# Patient Record
Sex: Male | Born: 1935 | Race: White | Hispanic: No | Smoking: Former smoker
Health system: Southern US, Community
[De-identification: ages and names within clinical notes are randomized; demographics above are authoritative.]

## PROBLEM LIST (undated history)

## (undated) DIAGNOSIS — I10 Essential (primary) hypertension: Secondary | ICD-10-CM

## (undated) DIAGNOSIS — I482 Chronic atrial fibrillation, unspecified: Secondary | ICD-10-CM

## (undated) DIAGNOSIS — E119 Type 2 diabetes mellitus without complications: Secondary | ICD-10-CM

## (undated) DIAGNOSIS — I639 Cerebral infarction, unspecified: Secondary | ICD-10-CM

## (undated) HISTORY — PX: PROSTATE SURGERY: SHX751

## (undated) HISTORY — PX: HERNIA REPAIR: SHX51

---

## 2014-09-10 DIAGNOSIS — R21 Rash and other nonspecific skin eruption: Secondary | ICD-10-CM | POA: Diagnosis not present

## 2014-09-10 DIAGNOSIS — Z6824 Body mass index (BMI) 24.0-24.9, adult: Secondary | ICD-10-CM | POA: Diagnosis not present

## 2014-09-11 DIAGNOSIS — R972 Elevated prostate specific antigen [PSA]: Secondary | ICD-10-CM | POA: Diagnosis not present

## 2014-09-11 DIAGNOSIS — R351 Nocturia: Secondary | ICD-10-CM | POA: Diagnosis not present

## 2014-09-11 DIAGNOSIS — N3090 Cystitis, unspecified without hematuria: Secondary | ICD-10-CM | POA: Diagnosis not present

## 2014-09-11 DIAGNOSIS — R3912 Poor urinary stream: Secondary | ICD-10-CM | POA: Diagnosis not present

## 2014-09-11 DIAGNOSIS — N401 Enlarged prostate with lower urinary tract symptoms: Secondary | ICD-10-CM | POA: Diagnosis not present

## 2014-09-20 DIAGNOSIS — R0982 Postnasal drip: Secondary | ICD-10-CM | POA: Diagnosis not present

## 2014-09-20 DIAGNOSIS — R21 Rash and other nonspecific skin eruption: Secondary | ICD-10-CM | POA: Diagnosis not present

## 2014-09-26 DIAGNOSIS — I10 Essential (primary) hypertension: Secondary | ICD-10-CM | POA: Diagnosis not present

## 2014-09-26 DIAGNOSIS — E860 Dehydration: Secondary | ICD-10-CM | POA: Diagnosis not present

## 2014-09-26 DIAGNOSIS — Z7982 Long term (current) use of aspirin: Secondary | ICD-10-CM | POA: Diagnosis not present

## 2014-09-26 DIAGNOSIS — E119 Type 2 diabetes mellitus without complications: Secondary | ICD-10-CM | POA: Diagnosis not present

## 2014-09-26 DIAGNOSIS — R079 Chest pain, unspecified: Secondary | ICD-10-CM | POA: Diagnosis not present

## 2014-09-26 DIAGNOSIS — J9811 Atelectasis: Secondary | ICD-10-CM | POA: Diagnosis not present

## 2014-09-26 DIAGNOSIS — Z87891 Personal history of nicotine dependence: Secondary | ICD-10-CM | POA: Diagnosis not present

## 2014-09-26 DIAGNOSIS — R531 Weakness: Secondary | ICD-10-CM | POA: Diagnosis not present

## 2014-09-26 DIAGNOSIS — R42 Dizziness and giddiness: Secondary | ICD-10-CM | POA: Diagnosis not present

## 2014-09-26 DIAGNOSIS — J984 Other disorders of lung: Secondary | ICD-10-CM | POA: Diagnosis not present

## 2014-10-10 DIAGNOSIS — Z6825 Body mass index (BMI) 25.0-25.9, adult: Secondary | ICD-10-CM | POA: Diagnosis not present

## 2014-10-10 DIAGNOSIS — I471 Supraventricular tachycardia: Secondary | ICD-10-CM | POA: Diagnosis not present

## 2014-10-10 DIAGNOSIS — I10 Essential (primary) hypertension: Secondary | ICD-10-CM | POA: Diagnosis not present

## 2014-10-10 DIAGNOSIS — E860 Dehydration: Secondary | ICD-10-CM | POA: Diagnosis not present

## 2014-10-16 DIAGNOSIS — I471 Supraventricular tachycardia: Secondary | ICD-10-CM | POA: Diagnosis not present

## 2014-10-16 DIAGNOSIS — I10 Essential (primary) hypertension: Secondary | ICD-10-CM | POA: Diagnosis not present

## 2014-10-16 DIAGNOSIS — R609 Edema, unspecified: Secondary | ICD-10-CM | POA: Diagnosis not present

## 2014-11-13 DIAGNOSIS — Z6825 Body mass index (BMI) 25.0-25.9, adult: Secondary | ICD-10-CM | POA: Diagnosis not present

## 2014-11-13 DIAGNOSIS — L282 Other prurigo: Secondary | ICD-10-CM | POA: Diagnosis not present

## 2014-12-18 DIAGNOSIS — E538 Deficiency of other specified B group vitamins: Secondary | ICD-10-CM | POA: Diagnosis not present

## 2014-12-18 DIAGNOSIS — I10 Essential (primary) hypertension: Secondary | ICD-10-CM | POA: Diagnosis not present

## 2014-12-18 DIAGNOSIS — E119 Type 2 diabetes mellitus without complications: Secondary | ICD-10-CM | POA: Diagnosis not present

## 2014-12-18 DIAGNOSIS — E785 Hyperlipidemia, unspecified: Secondary | ICD-10-CM | POA: Diagnosis not present

## 2014-12-18 DIAGNOSIS — G309 Alzheimer's disease, unspecified: Secondary | ICD-10-CM | POA: Diagnosis not present

## 2014-12-20 DIAGNOSIS — R269 Unspecified abnormalities of gait and mobility: Secondary | ICD-10-CM | POA: Diagnosis not present

## 2014-12-20 DIAGNOSIS — R2681 Unsteadiness on feet: Secondary | ICD-10-CM | POA: Diagnosis not present

## 2014-12-20 DIAGNOSIS — G309 Alzheimer's disease, unspecified: Secondary | ICD-10-CM | POA: Diagnosis not present

## 2014-12-22 DIAGNOSIS — S91331A Puncture wound without foreign body, right foot, initial encounter: Secondary | ICD-10-CM | POA: Diagnosis not present

## 2015-01-01 DIAGNOSIS — E119 Type 2 diabetes mellitus without complications: Secondary | ICD-10-CM | POA: Diagnosis not present

## 2015-01-26 DIAGNOSIS — I872 Venous insufficiency (chronic) (peripheral): Secondary | ICD-10-CM | POA: Diagnosis not present

## 2015-01-26 DIAGNOSIS — Z6826 Body mass index (BMI) 26.0-26.9, adult: Secondary | ICD-10-CM | POA: Diagnosis not present

## 2015-01-26 DIAGNOSIS — R600 Localized edema: Secondary | ICD-10-CM | POA: Diagnosis not present

## 2015-02-19 DIAGNOSIS — Z9181 History of falling: Secondary | ICD-10-CM | POA: Diagnosis not present

## 2015-02-19 DIAGNOSIS — M25531 Pain in right wrist: Secondary | ICD-10-CM | POA: Diagnosis not present

## 2015-02-19 DIAGNOSIS — Z6825 Body mass index (BMI) 25.0-25.9, adult: Secondary | ICD-10-CM | POA: Diagnosis not present

## 2015-02-19 DIAGNOSIS — Z139 Encounter for screening, unspecified: Secondary | ICD-10-CM | POA: Diagnosis not present

## 2015-02-19 DIAGNOSIS — S61509A Unspecified open wound of unspecified wrist, initial encounter: Secondary | ICD-10-CM | POA: Diagnosis not present

## 2015-03-12 DIAGNOSIS — N3090 Cystitis, unspecified without hematuria: Secondary | ICD-10-CM | POA: Diagnosis not present

## 2015-03-12 DIAGNOSIS — R3912 Poor urinary stream: Secondary | ICD-10-CM | POA: Diagnosis not present

## 2015-03-12 DIAGNOSIS — N401 Enlarged prostate with lower urinary tract symptoms: Secondary | ICD-10-CM | POA: Diagnosis not present

## 2015-03-12 DIAGNOSIS — R351 Nocturia: Secondary | ICD-10-CM | POA: Diagnosis not present

## 2015-03-12 DIAGNOSIS — N318 Other neuromuscular dysfunction of bladder: Secondary | ICD-10-CM | POA: Diagnosis not present

## 2015-03-12 DIAGNOSIS — R972 Elevated prostate specific antigen [PSA]: Secondary | ICD-10-CM | POA: Diagnosis not present

## 2015-05-20 DIAGNOSIS — N3090 Cystitis, unspecified without hematuria: Secondary | ICD-10-CM | POA: Diagnosis not present

## 2015-05-20 DIAGNOSIS — N3001 Acute cystitis with hematuria: Secondary | ICD-10-CM | POA: Diagnosis not present

## 2015-06-21 DIAGNOSIS — Z23 Encounter for immunization: Secondary | ICD-10-CM | POA: Diagnosis not present

## 2015-06-21 DIAGNOSIS — I10 Essential (primary) hypertension: Secondary | ICD-10-CM | POA: Diagnosis not present

## 2015-06-21 DIAGNOSIS — D519 Vitamin B12 deficiency anemia, unspecified: Secondary | ICD-10-CM | POA: Diagnosis not present

## 2015-06-21 DIAGNOSIS — E785 Hyperlipidemia, unspecified: Secondary | ICD-10-CM | POA: Diagnosis not present

## 2015-06-21 DIAGNOSIS — E119 Type 2 diabetes mellitus without complications: Secondary | ICD-10-CM | POA: Diagnosis not present

## 2015-06-27 DIAGNOSIS — N3001 Acute cystitis with hematuria: Secondary | ICD-10-CM | POA: Diagnosis not present

## 2015-06-27 DIAGNOSIS — R300 Dysuria: Secondary | ICD-10-CM | POA: Diagnosis not present

## 2015-07-22 DIAGNOSIS — N3090 Cystitis, unspecified without hematuria: Secondary | ICD-10-CM | POA: Diagnosis not present

## 2015-07-22 DIAGNOSIS — N3091 Cystitis, unspecified with hematuria: Secondary | ICD-10-CM | POA: Diagnosis not present

## 2015-08-06 DIAGNOSIS — N401 Enlarged prostate with lower urinary tract symptoms: Secondary | ICD-10-CM | POA: Diagnosis not present

## 2015-08-06 DIAGNOSIS — N3020 Other chronic cystitis without hematuria: Secondary | ICD-10-CM | POA: Diagnosis not present

## 2015-08-06 DIAGNOSIS — R351 Nocturia: Secondary | ICD-10-CM | POA: Diagnosis not present

## 2015-08-06 DIAGNOSIS — N318 Other neuromuscular dysfunction of bladder: Secondary | ICD-10-CM | POA: Diagnosis not present

## 2015-08-07 DIAGNOSIS — J069 Acute upper respiratory infection, unspecified: Secondary | ICD-10-CM | POA: Diagnosis not present

## 2015-08-07 DIAGNOSIS — Z6826 Body mass index (BMI) 26.0-26.9, adult: Secondary | ICD-10-CM | POA: Diagnosis not present

## 2015-08-07 DIAGNOSIS — J029 Acute pharyngitis, unspecified: Secondary | ICD-10-CM | POA: Diagnosis not present

## 2015-08-13 DIAGNOSIS — Z6825 Body mass index (BMI) 25.0-25.9, adult: Secondary | ICD-10-CM | POA: Diagnosis not present

## 2015-08-13 DIAGNOSIS — G309 Alzheimer's disease, unspecified: Secondary | ICD-10-CM | POA: Diagnosis not present

## 2015-08-13 DIAGNOSIS — R4182 Altered mental status, unspecified: Secondary | ICD-10-CM | POA: Diagnosis not present

## 2015-08-14 DIAGNOSIS — Z79899 Other long term (current) drug therapy: Secondary | ICD-10-CM | POA: Diagnosis not present

## 2015-08-14 DIAGNOSIS — I482 Chronic atrial fibrillation: Secondary | ICD-10-CM | POA: Diagnosis not present

## 2015-08-14 DIAGNOSIS — R531 Weakness: Secondary | ICD-10-CM | POA: Diagnosis not present

## 2015-08-14 DIAGNOSIS — R410 Disorientation, unspecified: Secondary | ICD-10-CM | POA: Diagnosis not present

## 2015-08-14 DIAGNOSIS — Z7982 Long term (current) use of aspirin: Secondary | ICD-10-CM | POA: Diagnosis not present

## 2015-08-14 DIAGNOSIS — F0390 Unspecified dementia without behavioral disturbance: Secondary | ICD-10-CM | POA: Diagnosis not present

## 2015-08-14 DIAGNOSIS — E7800 Pure hypercholesterolemia, unspecified: Secondary | ICD-10-CM | POA: Diagnosis not present

## 2015-08-14 DIAGNOSIS — N390 Urinary tract infection, site not specified: Secondary | ICD-10-CM | POA: Diagnosis not present

## 2015-08-14 DIAGNOSIS — Z7984 Long term (current) use of oral hypoglycemic drugs: Secondary | ICD-10-CM | POA: Diagnosis not present

## 2015-08-14 DIAGNOSIS — Z87440 Personal history of urinary (tract) infections: Secondary | ICD-10-CM | POA: Diagnosis not present

## 2015-08-14 DIAGNOSIS — E119 Type 2 diabetes mellitus without complications: Secondary | ICD-10-CM | POA: Diagnosis not present

## 2015-08-14 DIAGNOSIS — R4182 Altered mental status, unspecified: Secondary | ICD-10-CM | POA: Diagnosis not present

## 2015-08-14 DIAGNOSIS — E871 Hypo-osmolality and hyponatremia: Secondary | ICD-10-CM | POA: Diagnosis not present

## 2015-08-14 DIAGNOSIS — I10 Essential (primary) hypertension: Secondary | ICD-10-CM | POA: Diagnosis not present

## 2015-08-15 DIAGNOSIS — E871 Hypo-osmolality and hyponatremia: Secondary | ICD-10-CM | POA: Diagnosis not present

## 2015-08-15 DIAGNOSIS — R410 Disorientation, unspecified: Secondary | ICD-10-CM | POA: Diagnosis not present

## 2015-08-15 DIAGNOSIS — I482 Chronic atrial fibrillation: Secondary | ICD-10-CM | POA: Diagnosis not present

## 2015-08-15 DIAGNOSIS — E119 Type 2 diabetes mellitus without complications: Secondary | ICD-10-CM | POA: Diagnosis not present

## 2015-08-27 DIAGNOSIS — E871 Hypo-osmolality and hyponatremia: Secondary | ICD-10-CM | POA: Diagnosis not present

## 2015-08-27 DIAGNOSIS — Z1389 Encounter for screening for other disorder: Secondary | ICD-10-CM | POA: Diagnosis not present

## 2015-08-27 DIAGNOSIS — Z6825 Body mass index (BMI) 25.0-25.9, adult: Secondary | ICD-10-CM | POA: Diagnosis not present

## 2015-08-27 DIAGNOSIS — R410 Disorientation, unspecified: Secondary | ICD-10-CM | POA: Diagnosis not present

## 2015-08-27 DIAGNOSIS — I10 Essential (primary) hypertension: Secondary | ICD-10-CM | POA: Diagnosis not present

## 2015-09-10 DIAGNOSIS — R600 Localized edema: Secondary | ICD-10-CM | POA: Insufficient documentation

## 2015-09-10 DIAGNOSIS — I471 Supraventricular tachycardia: Secondary | ICD-10-CM | POA: Diagnosis not present

## 2015-09-10 DIAGNOSIS — I10 Essential (primary) hypertension: Secondary | ICD-10-CM | POA: Insufficient documentation

## 2015-09-11 DIAGNOSIS — J069 Acute upper respiratory infection, unspecified: Secondary | ICD-10-CM | POA: Diagnosis not present

## 2015-10-02 ENCOUNTER — Encounter: Payer: Self-pay | Admitting: Sports Medicine

## 2015-10-02 ENCOUNTER — Ambulatory Visit (INDEPENDENT_AMBULATORY_CARE_PROVIDER_SITE_OTHER): Payer: Medicare Other | Admitting: Sports Medicine

## 2015-10-02 DIAGNOSIS — M79672 Pain in left foot: Secondary | ICD-10-CM | POA: Diagnosis not present

## 2015-10-02 DIAGNOSIS — M79671 Pain in right foot: Secondary | ICD-10-CM | POA: Diagnosis not present

## 2015-10-02 DIAGNOSIS — I739 Peripheral vascular disease, unspecified: Secondary | ICD-10-CM

## 2015-10-02 DIAGNOSIS — L84 Corns and callosities: Secondary | ICD-10-CM | POA: Diagnosis not present

## 2015-10-02 DIAGNOSIS — E1142 Type 2 diabetes mellitus with diabetic polyneuropathy: Secondary | ICD-10-CM

## 2015-10-02 DIAGNOSIS — B351 Tinea unguium: Secondary | ICD-10-CM

## 2015-10-02 NOTE — Progress Notes (Signed)
Patient ID: Barry Jones, male   DOB: 06-28-1931, 82 y.o.   MRN: [id number] Subjective: Barry Jones is a 82 y.o. male patient with history of type 2 diabetes who presents to office today complaining of callus and long, painful nails  while ambulating in shoes; unable to trim. Patient states that the glucose reading this morning was not recorded; he checks occasionally always around 100 and if he needs to take meds he will. Patient denies any new changes in medication or new problems. Patient denies any new cramping, numbness, burning or tingling in the legs.  Review of Systems  All other systems reviewed and are negative.  There are no active problems to display for this patient.  No current outpatient prescriptions on file prior to visit.   No current facility-administered medications on file prior to visit.   Not on File    Objective: General: Patient is awake, alert, and oriented x 3 and in no acute distress.  Integument: Skin is warm, dry and supple bilateral. Nails are tender, long, thickened and  dystrophic with subungual debris, consistent with onychomycosis, 1-5 bilateral. No signs of infection. No open lesions. + callus sub met 1 and 5 present bilateral with no infection. Remaining integument unremarkable.  Vasculature:  Dorsalis Pedis pulse 1/4 bilateral. Posterior Tibial pulse  0/4 bilateral.  Capillary fill time <4 sec 1-5 bilateral. No hair growth to the level of the digits. Temperature gradient within normal limits. +varicosities present bilateral. 1+ pitting edema present bilateral ankles.   Neurology: The patient has diminished sensation measured with a 5.07/10g Semmes Weinstein Monofilament at all pedal sites bilateral . Vibratory sensation diminished bilateral with tuning fork. No Babinski sign present bilateral.   Musculoskeletal: No gross pedal deformities noted bilateral. Muscular strength 5/5 in all lower extremity muscular groups bilateral without pain on range of  motion . No tenderness with calf compression bilateral.  Assessment and Plan: Problem List Items Addressed This Visit    None    Visit Diagnoses    Dermatophytosis of nail    -  Primary    Relevant Medications    azithromycin (ZITHROMAX) 250 MG tablet    Callus of foot        Diabetic polyneuropathy associated with type 2 diabetes mellitus (HCC)        Relevant Medications    lisinopril (PRINIVIL,ZESTRIL) 5 MG tablet    PVD (peripheral vascular disease) (HCC)        Relevant Medications    atenolol (TENORMIN) 25 MG tablet    lisinopril (PRINIVIL,ZESTRIL) 5 MG tablet    hydrochlorothiazide (HYDRODIURIL) 25 MG tablet    Foot pain, bilateral          -Examined patient. -Discussed and educated patient on diabetic foot care, especially with regards to the vascular, neurological and musculoskeletal systems.  -Stressed the importance of good glycemic control and the detriment of not controlling glucose levels in relation to the foot. -Mechanically debrided callus using sterile chisel blade and all nails 1-5 bilateral using sterile nail nipper and filed with dremel without incident  -Recommend elevation of legs daily to assist with edema control -Recommend good supportive shoes daily  -Answered all patient questions -Patient to return in 3 months for at risk foot care -Patient advised to call the office if any problems or questions arise in the meantime.  Landis Martins, DPM

## 2015-10-02 NOTE — Patient Instructions (Signed)
Diabetes and Foot Care Diabetes may cause you to have problems because of poor blood supply (circulation) to your feet and legs. This may cause the skin on your feet to become thinner, break easier, and heal more slowly. Your skin may become dry, and the skin may peel and crack. You may also have nerve damage in your legs and feet causing decreased feeling in them. You may not notice minor injuries to your feet that could lead to infections or more serious problems. Taking care of your feet is one of the most important things you can do for yourself.  HOME CARE INSTRUCTIONS  Wear shoes at all times, even in the house. Do not go barefoot. Bare feet are easily injured.  Check your feet daily for blisters, cuts, and redness. If you cannot see the bottom of your feet, use a mirror or ask someone for help.  Wash your feet with warm water (do not use hot water) and mild soap. Then pat your feet and the areas between your toes until they are completely dry. Do not soak your feet as this can dry your skin.  Apply a moisturizing lotion or petroleum jelly (that does not contain alcohol and is unscented) to the skin on your feet and to dry, brittle toenails. Do not apply lotion between your toes.  Trim your toenails straight across. Do not dig under them or around the cuticle. File the edges of your nails with an emery board or nail file.  Do not cut corns or calluses or try to remove them with medicine.  Wear clean socks or stockings every day. Make sure they are not too tight. Do not wear knee-high stockings since they may decrease blood flow to your legs.  Wear shoes that fit properly and have enough cushioning. To break in new shoes, wear them for just a few hours a day. This prevents you from injuring your feet. Always look in your shoes before you put them on to be sure there are no objects inside.  Do not cross your legs. This may decrease the blood flow to your feet.  If you find a minor scrape,  cut, or break in the skin on your feet, keep it and the skin around it clean and dry. These areas may be cleansed with mild soap and water. Do not cleanse the area with peroxide, alcohol, or iodine.  When you remove an adhesive bandage, be sure not to damage the skin around it.  If you have a wound, look at it several times a day to make sure it is healing.  Do not use heating pads or hot water bottles. They may burn your skin. If you have lost feeling in your feet or legs, you may not know it is happening until it is too late.  Make sure your health care provider performs a complete foot exam at least annually or more often if you have foot problems. Report any cuts, sores, or bruises to your health care provider immediately. SEEK MEDICAL CARE IF:   You have an injury that is not healing.  You have cuts or breaks in the skin.  You have an ingrown nail.  You notice redness on your legs or feet.  You feel burning or tingling in your legs or feet.  You have pain or cramps in your legs and feet.  Your legs or feet are numb.  Your feet always feel cold. SEEK IMMEDIATE MEDICAL CARE IF:   There is increasing redness,   swelling, or pain in or around a wound.  There is a red line that goes up your leg.  Pus is coming from a wound.  You develop a fever or as directed by your health care provider.  You notice a bad smell coming from an ulcer or wound.   This information is not intended to replace advice given to you by your health care provider. Make sure you discuss any questions you have with your health care provider.   Document Released: 08/21/2000 Document Revised: 04/26/2013 Document Reviewed: 01/31/2013 Elsevier Interactive Patient Education 2016 [employer]  

## 2015-10-02 NOTE — Progress Notes (Deleted)
   Subjective:    Patient ID: Barry Jones, male    DOB: Dec 12, 1930, 82 y.o.   MRN: [id number]  HPI    Review of Systems  All other systems reviewed and are negative.      Objective:   Physical Exam        Assessment & Plan:

## 2015-10-16 DIAGNOSIS — I10 Essential (primary) hypertension: Secondary | ICD-10-CM | POA: Diagnosis not present

## 2015-10-16 DIAGNOSIS — D519 Vitamin B12 deficiency anemia, unspecified: Secondary | ICD-10-CM | POA: Diagnosis not present

## 2015-10-16 DIAGNOSIS — E119 Type 2 diabetes mellitus without complications: Secondary | ICD-10-CM | POA: Diagnosis not present

## 2015-10-16 DIAGNOSIS — Z1389 Encounter for screening for other disorder: Secondary | ICD-10-CM | POA: Diagnosis not present

## 2015-10-16 DIAGNOSIS — E785 Hyperlipidemia, unspecified: Secondary | ICD-10-CM | POA: Diagnosis not present

## 2015-11-19 DIAGNOSIS — L308 Other specified dermatitis: Secondary | ICD-10-CM | POA: Diagnosis not present

## 2015-11-30 DIAGNOSIS — R600 Localized edema: Secondary | ICD-10-CM | POA: Diagnosis not present

## 2015-12-02 DIAGNOSIS — M79605 Pain in left leg: Secondary | ICD-10-CM | POA: Diagnosis not present

## 2015-12-02 DIAGNOSIS — M79604 Pain in right leg: Secondary | ICD-10-CM | POA: Diagnosis not present

## 2015-12-02 DIAGNOSIS — M7989 Other specified soft tissue disorders: Secondary | ICD-10-CM | POA: Diagnosis not present

## 2015-12-02 DIAGNOSIS — R600 Localized edema: Secondary | ICD-10-CM | POA: Diagnosis not present

## 2015-12-18 DIAGNOSIS — J069 Acute upper respiratory infection, unspecified: Secondary | ICD-10-CM | POA: Diagnosis not present

## 2015-12-18 DIAGNOSIS — L03115 Cellulitis of right lower limb: Secondary | ICD-10-CM | POA: Diagnosis not present

## 2015-12-30 DIAGNOSIS — L02211 Cutaneous abscess of abdominal wall: Secondary | ICD-10-CM | POA: Diagnosis not present

## 2015-12-30 DIAGNOSIS — Z6824 Body mass index (BMI) 24.0-24.9, adult: Secondary | ICD-10-CM | POA: Diagnosis not present

## 2015-12-31 DIAGNOSIS — Z6824 Body mass index (BMI) 24.0-24.9, adult: Secondary | ICD-10-CM | POA: Diagnosis not present

## 2015-12-31 DIAGNOSIS — I639 Cerebral infarction, unspecified: Secondary | ICD-10-CM | POA: Diagnosis not present

## 2015-12-31 DIAGNOSIS — N3000 Acute cystitis without hematuria: Secondary | ICD-10-CM | POA: Diagnosis not present

## 2015-12-31 DIAGNOSIS — R4182 Altered mental status, unspecified: Secondary | ICD-10-CM | POA: Diagnosis not present

## 2015-12-31 DIAGNOSIS — N390 Urinary tract infection, site not specified: Secondary | ICD-10-CM | POA: Diagnosis not present

## 2016-01-01 ENCOUNTER — Ambulatory Visit: Payer: Medicare Other | Admitting: Sports Medicine

## 2016-01-13 DIAGNOSIS — E871 Hypo-osmolality and hyponatremia: Secondary | ICD-10-CM | POA: Diagnosis not present

## 2016-01-13 DIAGNOSIS — E1129 Type 2 diabetes mellitus with other diabetic kidney complication: Secondary | ICD-10-CM | POA: Diagnosis not present

## 2016-01-13 DIAGNOSIS — D519 Vitamin B12 deficiency anemia, unspecified: Secondary | ICD-10-CM | POA: Diagnosis not present

## 2016-01-13 DIAGNOSIS — I10 Essential (primary) hypertension: Secondary | ICD-10-CM | POA: Diagnosis not present

## 2016-01-13 DIAGNOSIS — E785 Hyperlipidemia, unspecified: Secondary | ICD-10-CM | POA: Diagnosis not present

## 2016-01-15 DIAGNOSIS — I639 Cerebral infarction, unspecified: Secondary | ICD-10-CM | POA: Diagnosis not present

## 2016-01-15 DIAGNOSIS — R011 Cardiac murmur, unspecified: Secondary | ICD-10-CM | POA: Diagnosis not present

## 2016-01-15 DIAGNOSIS — I63233 Cerebral infarction due to unspecified occlusion or stenosis of bilateral carotid arteries: Secondary | ICD-10-CM | POA: Diagnosis not present

## 2016-01-15 DIAGNOSIS — I517 Cardiomegaly: Secondary | ICD-10-CM | POA: Diagnosis not present

## 2016-01-16 DIAGNOSIS — I639 Cerebral infarction, unspecified: Secondary | ICD-10-CM | POA: Diagnosis not present

## 2016-01-21 DIAGNOSIS — I639 Cerebral infarction, unspecified: Secondary | ICD-10-CM | POA: Diagnosis not present

## 2016-01-21 DIAGNOSIS — R399 Unspecified symptoms and signs involving the genitourinary system: Secondary | ICD-10-CM | POA: Diagnosis not present

## 2016-01-21 DIAGNOSIS — Z6823 Body mass index (BMI) 23.0-23.9, adult: Secondary | ICD-10-CM | POA: Diagnosis not present

## 2016-01-26 DIAGNOSIS — N390 Urinary tract infection, site not specified: Secondary | ICD-10-CM | POA: Diagnosis not present

## 2016-01-26 DIAGNOSIS — R4182 Altered mental status, unspecified: Secondary | ICD-10-CM | POA: Diagnosis not present

## 2016-02-12 DIAGNOSIS — N390 Urinary tract infection, site not specified: Secondary | ICD-10-CM | POA: Diagnosis not present

## 2016-02-12 DIAGNOSIS — I639 Cerebral infarction, unspecified: Secondary | ICD-10-CM | POA: Diagnosis not present

## 2016-02-12 DIAGNOSIS — R634 Abnormal weight loss: Secondary | ICD-10-CM | POA: Diagnosis not present

## 2016-02-12 DIAGNOSIS — Z6822 Body mass index (BMI) 22.0-22.9, adult: Secondary | ICD-10-CM | POA: Diagnosis not present

## 2016-02-12 DIAGNOSIS — I10 Essential (primary) hypertension: Secondary | ICD-10-CM | POA: Diagnosis not present

## 2016-03-05 ENCOUNTER — Encounter: Payer: Self-pay | Admitting: Sports Medicine

## 2016-03-05 ENCOUNTER — Ambulatory Visit (INDEPENDENT_AMBULATORY_CARE_PROVIDER_SITE_OTHER): Payer: Medicare Other | Admitting: Sports Medicine

## 2016-03-05 DIAGNOSIS — B351 Tinea unguium: Secondary | ICD-10-CM

## 2016-03-05 DIAGNOSIS — M79672 Pain in left foot: Secondary | ICD-10-CM

## 2016-03-05 DIAGNOSIS — M79671 Pain in right foot: Secondary | ICD-10-CM | POA: Diagnosis not present

## 2016-03-05 DIAGNOSIS — E1142 Type 2 diabetes mellitus with diabetic polyneuropathy: Secondary | ICD-10-CM

## 2016-03-05 DIAGNOSIS — I739 Peripheral vascular disease, unspecified: Secondary | ICD-10-CM

## 2016-03-05 DIAGNOSIS — L84 Corns and callosities: Secondary | ICD-10-CM

## 2016-03-05 NOTE — Progress Notes (Signed)
Patient ID: Barry Jones, male   DOB: [birth year], 82 y.o.   MRN: [id number]   Subjective: Barry Jones is a 82 y.o. male patient with history of type 2 diabetes who presents to office today complaining of callus and long, painful nails  while ambulating in shoes; unable to trim. Patient states that the glucose reading this morning was 120; patient is assisted by son who reports this. Patient denies any new changes in medication or new problems. Patient denies any new cramping, numbness, burning or tingling in the legs.   Patient Active Problem List   Diagnosis Date Noted  . Essential (primary) hypertension 09/10/2015  . Local edema 09/10/2015  . Supraventricular tachycardia ([place]) 09/10/2015   Current Outpatient Prescriptions on File Prior to Visit  Medication Sig Dispense Refill  . atenolol (TENORMIN) 25 MG tablet     . azithromycin (ZITHROMAX) 250 MG tablet     . hydrochlorothiazide (HYDRODIURIL) 25 MG tablet     . lisinopril (PRINIVIL,ZESTRIL) 5 MG tablet     . ONE TOUCH ULTRA TEST test strip      No current facility-administered medications on file prior to visit.   Allergies  Allergen Reactions  . Ciprofloxacin     Objective: General: Patient is awake, alert, and oriented x 3 and in no acute distress.  Integument: Skin is warm, dry and supple bilateral. Nails are tender, long, thickened and  dystrophic with subungual debris, consistent with onychomycosis, 1-5 bilateral. No signs of infection. No open lesions. + callus sub met 1 and 5 present bilateral with no infection. Remaining integument unremarkable.  Vasculature:  Dorsalis Pedis pulse 1/4 bilateral. Posterior Tibial pulse  0/4 bilateral.  Capillary fill time <4 sec 1-5 bilateral. No hair growth to the level of the digits. Temperature gradient within normal limits. +varicosities present bilateral. 1+ pitting edema present bilateral ankles.   Neurology: The patient has diminished sensation measured with a 5.07/10g Semmes  Weinstein Monofilament at all pedal sites bilateral . Vibratory sensation diminished bilateral with tuning fork. No Babinski sign present bilateral.   Musculoskeletal: No gross pedal deformities noted bilateral. Muscular strength 5/5 in all lower extremity muscular groups bilateral without pain on range of motion . No tenderness with calf compression bilateral.  Assessment and Plan: Problem List Items Addressed This Visit    None    Visit Diagnoses    Dermatophytosis of nail    -  Primary    Callus of foot        PVD (peripheral vascular disease) ([place])        Diabetic polyneuropathy associated with type 2 diabetes mellitus (HCC)        Foot pain, bilateral          -Examined patient. -Discussed and educated patient on diabetic foot care, especially with regards to the vascular, neurological and musculoskeletal systems.  -Stressed the importance of good glycemic control and the detriment of not controlling glucose levels in relation to the foot. -Mechanically debrided callus using sterile chisel blade and all nails 1-5 bilateral using sterile nail nipper and filed with dremel without incident  -Recommend elevation of legs daily to assist with edema control -Recommend good supportive shoes daily  -Answered all patient questions -Patient to return in 3 months for at risk foot care -Patient advised to call the office if any problems or questions arise in the meantime.  Landis Martins, DPM

## 2016-04-07 DIAGNOSIS — I639 Cerebral infarction, unspecified: Secondary | ICD-10-CM

## 2016-04-07 HISTORY — DX: Cerebral infarction, unspecified: I63.9

## 2016-04-09 DIAGNOSIS — E785 Hyperlipidemia, unspecified: Secondary | ICD-10-CM | POA: Diagnosis not present

## 2016-04-09 DIAGNOSIS — E663 Overweight: Secondary | ICD-10-CM | POA: Diagnosis not present

## 2016-04-09 DIAGNOSIS — T783XXA Angioneurotic edema, initial encounter: Secondary | ICD-10-CM | POA: Diagnosis not present

## 2016-04-09 DIAGNOSIS — R600 Localized edema: Secondary | ICD-10-CM | POA: Diagnosis not present

## 2016-04-09 DIAGNOSIS — E1129 Type 2 diabetes mellitus with other diabetic kidney complication: Secondary | ICD-10-CM | POA: Diagnosis not present

## 2016-04-09 DIAGNOSIS — R220 Localized swelling, mass and lump, head: Secondary | ICD-10-CM | POA: Diagnosis not present

## 2016-04-09 DIAGNOSIS — D519 Vitamin B12 deficiency anemia, unspecified: Secondary | ICD-10-CM | POA: Diagnosis not present

## 2016-04-18 ENCOUNTER — Inpatient Hospital Stay (HOSPITAL_COMMUNITY)
Admission: AD | Admit: 2016-04-18 | Discharge: 2016-04-21 | DRG: 065 | Disposition: A | Discharge status: Home Health | Payer: Medicare Other | Source: Other Acute Inpatient Hospital | Attending: Neurology | Admitting: Neurology

## 2016-04-18 ENCOUNTER — Encounter (HOSPITAL_COMMUNITY): Payer: Self-pay | Admitting: *Deleted

## 2016-04-18 DIAGNOSIS — E1159 Type 2 diabetes mellitus with other circulatory complications: Secondary | ICD-10-CM | POA: Diagnosis not present

## 2016-04-18 DIAGNOSIS — I351 Nonrheumatic aortic (valve) insufficiency: Secondary | ICD-10-CM | POA: Diagnosis present

## 2016-04-18 DIAGNOSIS — R29818 Other symptoms and signs involving the nervous system: Secondary | ICD-10-CM | POA: Diagnosis not present

## 2016-04-18 DIAGNOSIS — I6350 Cerebral infarction due to unspecified occlusion or stenosis of unspecified cerebral artery: Secondary | ICD-10-CM | POA: Diagnosis not present

## 2016-04-18 DIAGNOSIS — Z79899 Other long term (current) drug therapy: Secondary | ICD-10-CM

## 2016-04-18 DIAGNOSIS — E785 Hyperlipidemia, unspecified: Secondary | ICD-10-CM | POA: Diagnosis not present

## 2016-04-18 DIAGNOSIS — I4891 Unspecified atrial fibrillation: Secondary | ICD-10-CM | POA: Diagnosis not present

## 2016-04-18 DIAGNOSIS — I63411 Cerebral infarction due to embolism of right middle cerebral artery: Secondary | ICD-10-CM | POA: Diagnosis not present

## 2016-04-18 DIAGNOSIS — E7800 Pure hypercholesterolemia, unspecified: Secondary | ICD-10-CM | POA: Diagnosis not present

## 2016-04-18 DIAGNOSIS — I481 Persistent atrial fibrillation: Secondary | ICD-10-CM | POA: Diagnosis not present

## 2016-04-18 DIAGNOSIS — Z9282 Status post administration of tPA (rtPA) in a different facility within the last 24 hours prior to admission to current facility: Secondary | ICD-10-CM

## 2016-04-18 DIAGNOSIS — I482 Chronic atrial fibrillation: Secondary | ICD-10-CM | POA: Diagnosis not present

## 2016-04-18 DIAGNOSIS — I63311 Cerebral infarction due to thrombosis of right middle cerebral artery: Secondary | ICD-10-CM

## 2016-04-18 DIAGNOSIS — I639 Cerebral infarction, unspecified: Secondary | ICD-10-CM | POA: Diagnosis not present

## 2016-04-18 DIAGNOSIS — I6789 Other cerebrovascular disease: Secondary | ICD-10-CM | POA: Diagnosis not present

## 2016-04-18 DIAGNOSIS — R414 Neurologic neglect syndrome: Secondary | ICD-10-CM | POA: Diagnosis not present

## 2016-04-18 DIAGNOSIS — G8194 Hemiplegia, unspecified affecting left nondominant side: Secondary | ICD-10-CM | POA: Diagnosis present

## 2016-04-18 DIAGNOSIS — F0390 Unspecified dementia without behavioral disturbance: Secondary | ICD-10-CM | POA: Diagnosis present

## 2016-04-18 DIAGNOSIS — I10 Essential (primary) hypertension: Secondary | ICD-10-CM | POA: Diagnosis present

## 2016-04-18 DIAGNOSIS — I638 Other cerebral infarction: Secondary | ICD-10-CM | POA: Diagnosis not present

## 2016-04-18 DIAGNOSIS — H9193 Unspecified hearing loss, bilateral: Secondary | ICD-10-CM | POA: Diagnosis present

## 2016-04-18 DIAGNOSIS — Z881 Allergy status to other antibiotic agents status: Secondary | ICD-10-CM

## 2016-04-18 DIAGNOSIS — I6340 Cerebral infarction due to embolism of unspecified cerebral artery: Secondary | ICD-10-CM | POA: Diagnosis present

## 2016-04-18 DIAGNOSIS — E119 Type 2 diabetes mellitus without complications: Secondary | ICD-10-CM | POA: Diagnosis not present

## 2016-04-18 DIAGNOSIS — R531 Weakness: Secondary | ICD-10-CM | POA: Diagnosis not present

## 2016-04-18 DIAGNOSIS — I272 Other secondary pulmonary hypertension: Secondary | ICD-10-CM | POA: Diagnosis not present

## 2016-04-18 LAB — MRSA PCR SCREENING: MRSA by PCR: NEGATIVE

## 2016-04-18 MED ORDER — STROKE: EARLY STAGES OF RECOVERY BOOK
Freq: Once | Status: AC
  Administered 2016-04-18: 22:00:00
  Filled 2016-04-18: qty 1

## 2016-04-18 MED ORDER — LABETALOL HCL 5 MG/ML IV SOLN
INTRAVENOUS | Status: AC
  Administered 2016-04-18: 10 mg via INTRAVENOUS
  Filled 2016-04-18: qty 4

## 2016-04-18 MED ORDER — SODIUM CHLORIDE 0.9 % IV SOLN
INTRAVENOUS | Status: DC
  Administered 2016-04-18 – 2016-04-20 (×3): via INTRAVENOUS

## 2016-04-18 MED ORDER — ACETAMINOPHEN 650 MG RE SUPP: 650.0000 mg | RECTAL | Status: DC | PRN

## 2016-04-18 MED ORDER — LABETALOL HCL 5 MG/ML IV SOLN
10.0000 mg | INTRAVENOUS | Status: DC | PRN
  Administered 2016-04-18 – 2016-04-20 (×4): 10 mg via INTRAVENOUS
  Filled 2016-04-18 (×3): qty 4

## 2016-04-18 MED ORDER — PANTOPRAZOLE SODIUM 40 MG PO TBEC
40.0000 mg | DELAYED_RELEASE_TABLET | Freq: Every day | ORAL | Status: DC
  Administered 2016-04-19 – 2016-04-21 (×3): 40 mg via ORAL
  Filled 2016-04-18 (×3): qty 1

## 2016-04-18 MED ORDER — LABETALOL HCL 5 MG/ML IV SOLN: 10.0000 mg | INTRAVENOUS | Status: DC | PRN

## 2016-04-18 MED ORDER — ACETAMINOPHEN 325 MG PO TABS: 650.0000 mg | ORAL_TABLET | ORAL | Status: DC | PRN

## 2016-04-18 NOTE — H&P (Signed)
Admission H&P    Chief Complaint: Acute left hemiparesis.  HPI: Barry Jones is an 82 y.o. male with a history of atrial fibrillation, diabetes mellitus, hypertension and dementia, [facility] following acute onset of left-sided weakness at 1400 today. Patient was noted to have gaze to his right side, and addition to left side weakness. CT scan of his head showed no acute intracranial abnormality. He was deemed a candidate for TPA which was administered. He has shown improvement in of left extremities following TPA administration. He still has gaze to the right side. Blood pressures been elevated, requiring intermittent treatment with IV labetalol. NIH stroke score at the time of this evaluation was 6.  LSN: 2:00 PM on 04/18/2016 tPA Given: Yes mRankin:  Past medical history: Hypertension Diabetes mellitus Atrial fibrillation Dementia Hearing loss  No past surgical history on file.  Family history: Reviewed and noncontributory  Social History:  has an unknown smoking status. He has never used smokeless tobacco. His alcohol and drug histories are not on file.  Allergies:  Allergies  Allergen Reactions  . Ciprofloxacin     Medications Prior to Admission  Medication Sig Dispense Refill  . atenolol (TENORMIN) 25 MG tablet     . azithromycin (ZITHROMAX) 250 MG tablet     . hydrochlorothiazide (HYDRODIURIL) 25 MG tablet     . lisinopril (PRINIVIL,ZESTRIL) 5 MG tablet     . ONE TOUCH ULTRA TEST test strip       ROS: History obtained from patient's sons.  General ROS: negative for - chills, fatigue, fever, night sweats, weight gain or weight loss Psychological ROS: negative for - behavioral disorder, hallucinations, memory difficulties, mood swings or suicidal ideation Ophthalmic ROS: negative for - blurry vision, double vision, eye pain or loss of vision ENT ROS: negative for - epistaxis, nasal discharge, oral lesions, sore throat, tinnitus or  vertigo Allergy and Immunology ROS: negative for - hives or itchy/watery eyes Hematological and Lymphatic ROS: negative for - bleeding problems, bruising or swollen lymph nodes Endocrine ROS: negative for - galactorrhea, hair pattern changes, polydipsia/polyuria or temperature intolerance Respiratory ROS: negative for - cough, hemoptysis, shortness of breath or wheezing Cardiovascular ROS: negative for - chest pain, dyspnea on exertion, edema or irregular heartbeat Gastrointestinal ROS: negative for - abdominal pain, diarrhea, hematemesis, nausea/vomiting or stool incontinence Genito-Urinary ROS: negative for - dysuria, hematuria, incontinence or urinary frequency/urgency Musculoskeletal ROS: negative for - joint swelling or muscular weakness Neurological ROS: as noted in HPI Dermatological ROS: negative for rash and skin lesion changes  Physical Examination: Blood pressure (!) 184/108, pulse (!) 51, resp. rate 20, SpO2 99 %.  HEENT-  Normocephalic, no lesions, without obvious abnormality.  Normal external eye and conjunctiva.  Normal TM's bilaterally.  Normal auditory canals and external ears. Normal external nose, mucus membranes and septum.  Normal pharynx. Neck supple with no masses, nodes, nodules or enlargement. Cardiovascular - irregularly irregular rhythm, S1, S2 normal and no S3 or S4 Lungs - chest clear, no wheezing, rales, normal symmetric air entry Abdomen - soft, non-tender; bowel sounds normal; no masses,  no organomegaly Extremities - no joint deformities, effusion, or inflammation and no edema  Neurologic Examination: Mental Status: Alert, oriented, no acute distress.  Speech slightly slurred without evidence of aphasia. Able to follow commands without difficulty. Cranial Nerves: Left homonymous hemianopsia. III/IV/VI-Pupils were equal and reacted normally to light. Eyes were conjugately deviated to the right side.    V/VII-no facial numbness; mild left lower facial  weakness. VIII-moderately severe hearing loss bilaterally. X-mild dysarthria. XI: trapezius strength/neck flexion strength normal bilaterally XII-midline tongue extension with normal strength. Motor: 5/5 bilaterally with normal tone and bulk; no drift of any extremities Sensory: Normal throughout. Deep Tendon Reflexes: 1+ and symmetric. Plantars: Flexor on the right and mute on the left. Cerebellar: Normal finger-to-nose testing. Carotid auscultation: Normal  No results found for this or any previous visit (from the past 48 hour(s)). No results found.  Assessment: 82 y.o. male with multiple risk factors for stroke, including atrial fibrillation, presenting with acute right MCA territory ischemic stroke, which improved following TPA administration.  Stroke Risk Factors - atrial fibrillation, diabetes mellitus and hypertension  Plan: 1. HgbA1c, fasting lipid panel 2. MRI, MRA  of the brain without contrast 3. PT consult, OT consult, Speech consult 4. Echocardiogram 5. Carotid dopplers 6. Prophylactic therapy-Antiplatelet med: Aspirin If CT scan of the head shows no intracranial hemorrhage 24 hours post TPA administration 7. Risk factor modification 8. Telemetry monitoring  C.R. Roseanne Reno, MD [facility] [phone]  04/18/2016, 9:58 PM

## 2016-04-19 ENCOUNTER — Inpatient Hospital Stay (HOSPITAL_COMMUNITY): Payer: Medicare Other

## 2016-04-19 DIAGNOSIS — I63311 Cerebral infarction due to thrombosis of right middle cerebral artery: Secondary | ICD-10-CM

## 2016-04-19 LAB — LIPID PANEL
CHOL/HDL RATIO: 3.4 ratio
Cholesterol: 151 mg/dL (ref 0–200)
HDL: 45 mg/dL (ref >40)
LDL Cholesterol: 96 mg/dL (ref 0–99)
Triglycerides: 51 mg/dL (ref <150)
VLDL: 10 mg/dL (ref 0–40)

## 2016-04-19 LAB — GLUCOSE, CAPILLARY: GLUCOSE-CAPILLARY: 208 mg/dL — AB (ref 65–99)

## 2016-04-19 MED ORDER — ATENOLOL 50 MG PO TABS: 25.0000 mg | ORAL_TABLET | Freq: Three times a day (TID) | ORAL | Status: DC

## 2016-04-19 MED ORDER — ATORVASTATIN CALCIUM 10 MG PO TABS
20.0000 mg | ORAL_TABLET | Freq: Every day | ORAL | Status: DC
  Filled 2016-04-19: qty 2

## 2016-04-19 MED ORDER — ATENOLOL 25 MG PO TABS
25.0000 mg | ORAL_TABLET | Freq: Two times a day (BID) | ORAL | Status: DC
  Administered 2016-04-19 – 2016-04-21 (×4): 25 mg via ORAL
  Filled 2016-04-19 (×4): qty 1

## 2016-04-19 MED ORDER — DIAZEPAM 5 MG/ML IJ SOLN
INTRAMUSCULAR | Status: AC
  Filled 2016-04-19: qty 2

## 2016-04-19 MED ORDER — DIAZEPAM 5 MG/ML IJ SOLN
5.0000 mg | Freq: Once | INTRAMUSCULAR | Status: AC
  Administered 2016-04-19: 5 mg via INTRAVENOUS

## 2016-04-19 NOTE — Progress Notes (Signed)
[facility] arrived [facility]. Per report, patient was given TPA at 1530 and finished infusing at 1630.

## 2016-04-19 NOTE — Progress Notes (Signed)
PT Cancellation Note  Patient Details Name: Barry Jones MRN: [id number] DOB: 03/11/1931   Cancelled Treatment:    Reason Eval/Treat Not Completed: Patient not medically ready (still within TPA windown, until 15:30).  Will need updated activity orders, once medically appropriate, before PT evaluation can be completed. Will follow acutely.  Encarnacion ChuAshley Nysia Dell PT, DPT  Pager: [phone] Phone: [phone] 04/19/2016, 8:59 AM

## 2016-04-19 NOTE — Progress Notes (Signed)
STROKE TEAM PROGRESS NOTE   HISTORY OF PRESENT ILLNESS (per record) Barry Jones is an 82 y.o. male with a history of atrial fibrillation, diabetes mellitus, hypertension and dementia, brought to [facility] ED following acute onset of left-sided weakness at 1400 today. Patient was noted to have gaze to his right side, and addition to left side weakness. CT scan of his head showed no acute intracranial abnormality. He was deemed a candidate for TPA which was administered. He has shown improvement in of left extremities following TPA administration. He still has gaze to the right side. Blood pressures been elevated, requiring intermittent treatment with IV labetalol. NIH stroke score at the time of this evaluation was 6.  LSN: 2:00 PM on 04/18/2016 tPA Given: Yes mRankin:   SUBJECTIVE (INTERVAL HISTORY) Denies any headache except mild one.  CT Brain at 24 hours shows right parietal hematoma.  MRI Brain shows acute infarcts in the right frontal, right posterior insular cortex, right parietal areas.  There is an old left posterior temporal and occipital ischemic infarct.  MRA Brain is normal.  CDUS is normal.  TTE is pending.  Tele shows atrial fibrillation.  LDL 96, HDL 45, TG 51.    OBJECTIVE Temp:  [97.5 F (36.4 C)-99.4 F (37.4 C)] 99.4 F (37.4 C) (08/13 1600) Pulse Rate:  [45-158] 102 (08/13 1500) Cardiac Rhythm: Atrial fibrillation (08/13 0800) Resp:  [10-23] 20 (08/13 1500) BP: (116-185)/(56-122) 126/56 (08/13 1500) SpO2:  [93 %-100 %] 97 % (08/13 1500)  CBC: No results for input(s): WBC, NEUTROABS, HGB, HCT, MCV, PLT in the last 168 hours.  Basic Metabolic Panel: No results for input(s): NA, K, CL, CO2, GLUCOSE, BUN, CREATININE, CALCIUM, MG, PHOS in the last 168 hours.  Lipid Panel:     Component Value Date/Time   CHOL 151 04/19/2016 0259   TRIG 51 04/19/2016 0259   HDL 45 04/19/2016 0259   CHOLHDL 3.4 04/19/2016 0259   VLDL 10 04/19/2016 0259   LDLCALC 96  04/19/2016 0259   HgbA1c: No results found for: HGBA1C Urine Drug Screen: No results found for: LABOPIA, COCAINSCRNUR, LABBENZ, AMPHETMU, THCU, LABBARB    IMAGING  No results found.     PHYSICAL EXAM Awake, alert, oriented. Language-fluent, Comprehension, naming, and repetition is normal.   No facial asymmetry.  Mild left arm and leg weakness. Coordination- intact.   ASSESSMENT/PLAN Mr. Barry Jones is a 82 y.o. male with history of atrial fibrillation (not anticoagulated ), diabetes mellitus, hypertension, and dementia presenting with left-sided weakness and right gaze preference. He received IV t-PA   Stroke:  Non-dominant - embolic secondary to atrial fibrillation without anticoagulation.  (see my subjective and impression )  MRI  pending  MRA pending  Head CT - pending  Carotid Doppler 1-39% ICA plaquing.  Vertebral artery flow is antegrade.   2D Echo pending  LDL 96  HgbA1c pending  VTE prophylaxis -SCDs  Diet Heart Room service appropriate? Yes; Fluid consistency: Thin  aspirin 81 mg daily prior to admission, now on No antithrombotic  Patient counseled to be compliant with his antithrombotic medications  Ongoing aggressive stroke risk factor management  Therapy recommendations: Pending  Disposition:  Pending  Hypertension  Stable  Permissive hypertension (OK if < 220/120) but gradually normalize in 5-7 days  Long-term BP goal normotensive  Hyperlipidemia  Home meds:  No lipid lowering medications prior to admission  LDL 96, goal < 70  Add Lipitor 20 mg daily  Continue statin at discharge  Diabetes  HgbA1c pending, goal < 7.0  Unc / Controlled  Other Stroke Risk Factors  Advanced age  Atrial fibrillation not anticoagulated  Other Active Problems    Hospital day # 1  Impression:  Probable multiple small acute cardioembolic infarcts in the right hemisphere secondary to atrial fibrillation, not on anticoagulation.  He is s/p  IV tPA with secondary hemorrhagic conversion in the right parietal area.    - No antiplatelet agents. - No anticoagulation - BP is well controlled and will keep goal SBP <160 and DBP <90 using Labetalol. - continue statin - supportive care and rehab and observe for resorption of blood.  - currently doing well clinically.  STAT CT Brain with any mental status changes.   Barry SettleShervin Yousuf Ager, MD      To contact Stroke Continuity provider, please refer to [email or web address]. After hours, contact General Neurology

## 2016-04-19 NOTE — Progress Notes (Addendum)
Pt in MRI, extremely anxious and unable to cooperate.  Neuro paged. 1625: 5 mg IV Valium given. MRI attempted x2.

## 2016-04-19 NOTE — Progress Notes (Signed)
OT Cancellation Note  Patient Details Name: Barry Jones MRN: [id number] DOB: 11/07/1930   Cancelled Treatment:    Reason Eval/Treat Not Completed: Patient not medically ready (active bedrest orders)  Gaye AlkenBailey A Martine Trageser M.S., OTR/L Pager: [phone]  04/19/2016, 1:21 PM

## 2016-04-19 NOTE — Progress Notes (Signed)
VASCULAR LAB PRELIMINARY  PRELIMINARY  PRELIMINARY  PRELIMINARY  Carotid duplex completed.    Preliminary report:  1-39% ICA plaquing.  Vertebral artery flow is antegrade.   Christpher Stogsdill, RVT 04/19/2016, 8:46 AM

## 2016-04-20 ENCOUNTER — Encounter (HOSPITAL_COMMUNITY): Payer: Self-pay | Admitting: Neurology

## 2016-04-20 ENCOUNTER — Inpatient Hospital Stay (HOSPITAL_COMMUNITY): Payer: Medicare Other

## 2016-04-20 DIAGNOSIS — I638 Other cerebral infarction: Secondary | ICD-10-CM

## 2016-04-20 DIAGNOSIS — F0390 Unspecified dementia without behavioral disturbance: Secondary | ICD-10-CM

## 2016-04-20 DIAGNOSIS — I481 Persistent atrial fibrillation: Secondary | ICD-10-CM

## 2016-04-20 DIAGNOSIS — I63411 Cerebral infarction due to embolism of right middle cerebral artery: Principal | ICD-10-CM

## 2016-04-20 DIAGNOSIS — I6789 Other cerebrovascular disease: Secondary | ICD-10-CM

## 2016-04-20 LAB — VAS US CAROTID
LCCADDIAS: 9 cm/s
LCCADSYS: 53 cm/s
LCCAPSYS: 69 cm/s
LEFT ECA DIAS: -9 cm/s
LEFT VERTEBRAL DIAS: 12 cm/s
LICADSYS: -84 cm/s
Left CCA prox dias: 2 cm/s
Left ICA dist dias: -16 cm/s
Left ICA prox dias: -14 cm/s
Left ICA prox sys: -81 cm/s
RCCADSYS: -71 cm/s
RCCAPDIAS: 9 cm/s
RCCAPSYS: 56 cm/s
RIGHT ECA DIAS: 4 cm/s
RIGHT VERTEBRAL DIAS: 4 cm/s

## 2016-04-20 LAB — GLUCOSE, CAPILLARY
GLUCOSE-CAPILLARY: 156 mg/dL — AB (ref 65–99)
GLUCOSE-CAPILLARY: 179 mg/dL — AB (ref 65–99)
Glucose-Capillary: 172 mg/dL — ABNORMAL HIGH (ref 65–99)

## 2016-04-20 LAB — BASIC METABOLIC PANEL
Anion gap: 8 (ref 5–15)
BUN: 16 mg/dL (ref 6–20)
CALCIUM: 8.3 mg/dL — AB (ref 8.9–10.3)
CHLORIDE: 105 mmol/L (ref 101–111)
CO2: 25 mmol/L (ref 22–32)
CREATININE: 0.88 mg/dL (ref 0.61–1.24)
GFR calc Af Amer: >60 mL/min (ref >60)
GFR calc non Af Amer: >60 mL/min (ref >60)
Glucose, Bld: 173 mg/dL — ABNORMAL HIGH (ref 65–99)
Potassium: 4.1 mmol/L (ref 3.5–5.1)
SODIUM: 138 mmol/L (ref 135–145)

## 2016-04-20 LAB — CBC
HEMATOCRIT: 40.7 % (ref 39.0–52.0)
HEMOGLOBIN: 13.3 g/dL (ref 13.0–17.0)
MCH: 31.6 pg (ref 26.0–34.0)
MCHC: 32.7 g/dL (ref 30.0–36.0)
MCV: 96.7 fL (ref 78.0–100.0)
Platelets: 188 10*3/uL (ref 150–400)
RBC: 4.21 MIL/uL — ABNORMAL LOW (ref 4.22–5.81)
RDW: 15 % (ref 11.5–15.5)
WBC: 7.1 10*3/uL (ref 4.0–10.5)

## 2016-04-20 LAB — HEMOGLOBIN A1C
Hgb A1c MFr Bld: 6.4 % — ABNORMAL HIGH (ref 4.8–5.6)
MEAN PLASMA GLUCOSE: 137 mg/dL

## 2016-04-20 LAB — ECHOCARDIOGRAM COMPLETE

## 2016-04-20 LAB — TSH: TSH: 0.996 u[IU]/mL (ref 0.350–4.500)

## 2016-04-20 LAB — VITAMIN B12: Vitamin B-12: 672 pg/mL (ref 180–914)

## 2016-04-20 MED ORDER — INSULIN ASPART 100 UNIT/ML ~~LOC~~ SOLN
0.0000 [IU] | Freq: Every day | SUBCUTANEOUS | Status: DC
Start: 2016-04-20 — End: 2016-04-21

## 2016-04-20 MED ORDER — METFORMIN HCL 500 MG PO TABS
500.0000 mg | ORAL_TABLET | Freq: Two times a day (BID) | ORAL | Status: DC
Start: 2016-04-20 — End: 2016-04-21
  Administered 2016-04-20 – 2016-04-21 (×2): 500 mg via ORAL
  Filled 2016-04-20 (×2): qty 1

## 2016-04-20 MED ORDER — INSULIN ASPART 100 UNIT/ML ~~LOC~~ SOLN
0.0000 [IU] | Freq: Three times a day (TID) | SUBCUTANEOUS | Status: DC
  Administered 2016-04-20 – 2016-04-21 (×3): 2 [IU] via SUBCUTANEOUS

## 2016-04-20 NOTE — Evaluation (Signed)
Physical Therapy Evaluation Patient Details Name: Cletus GashDallas Whilden MRN: [id number] DOB: 08/02/1931 Today's Date: 04/20/2016   History of Present Illness  82 y.o. male with a history of atrial fibrillation, diabetes mellitus, hypertension and dementia, brought to [facility] ED following acute onset of left-sided weakness,right side gaze. MRI areas of acute infarction within the right MCA territory. The largest region in the right parietal lobe measures about 3 cm in size and is associated with some blood products, old left PCA territory infarction. Pt received TPA which was administered.  Clinical Impression  Pt was able to walk around the unit with min/min guard assist for safety/balance.  I am unsure of baseline as there is no family present during assessment.  Pt is unsteady on his feet, but admits to h/o falls.   PT to follow acutely for deficits listed below.       Follow Up Recommendations Supervision/Assistance - 24 hour;Home health PT (if no 24/7 assist, SNF appropriate.)    Equipment Recommendations  None recommended by PT    Recommendations for Other Services   NA    Precautions / Restrictions Precautions Precautions: Fall Restrictions Weight Bearing Restrictions: No      Mobility  Bed Mobility Overal bed mobility: Needs Assistance Bed Mobility: Supine to Sit     Supine to sit: Supervision     General bed mobility comments: supervision for safety  Transfers Overall transfer level: Needs assistance Equipment used: None Transfers: Sit to/from Stand Sit to Stand: Min guard         General transfer comment: Min guard assist for safety and balance   Ambulation/Gait Ambulation/Gait assistance: Min assist Ambulation Distance (Feet): 130 Feet Assistive device: 1 person hand held assist;None Gait Pattern/deviations: Step-through pattern;Shuffle;Trunk flexed (trunk anterior of feet) Gait velocity: decreased Gait velocity interpretation: Below normal speed for  age/gender General Gait Details: Pt with slow, shuffling gait pattern, min hand held assist transitioned to min guard assist without holding hands.  Pt is a high fall risk and at times needs steading assist at trunk for balance during gait.  However, hard to say what his baseline level of function is without family present.       Modified Rankin (Stroke Patients Only) Modified Rankin (Stroke Patients Only) Pre-Morbid Rankin Score: Slight disability Modified Rankin: Moderately severe disability     Balance Overall balance assessment: History of Falls;Needs assistance Sitting-balance support: Feet supported;No upper extremity supported Sitting balance-Leahy Scale: Good     Standing balance support: No upper extremity supported;Single extremity supported Standing balance-Leahy Scale: Fair                               Pertinent Vitals/Pain Pain Assessment: No/denies pain Faces Pain Scale: Hurts a little bit    Home Living Family/patient expects to be discharged to:: Private residence Living Arrangements: Alone Available Help at Discharge: Family;Available PRN/intermittently (per chart review, family checks on pt 3x per day) Type of Home: House Home Access: Stairs to enter Entrance Stairs-Rails: None Entrance Stairs-Number of Steps: 2   Home Equipment: None      Prior Function Level of Independence: Independent         Comments: Pt reports he still drives occasionally. Reports history of falls at home PTA. Pt reports he ambulates without AD, does laundry, eats out most of the time, cleans the house, children assist with yardwork.      Hand Dominance   Dominant Hand: Right  Extremity/Trunk Assessment   Upper Extremity Assessment: Defer to OT evaluation           Lower Extremity Assessment: Generalized weakness (grossly can resist bil legs)      Cervical / Trunk Assessment: Kyphotic  Communication   Communication: HOH (wears bil hearing aids)   Cognition Arousal/Alertness: Awake/alert Behavior During Therapy: WFL for tasks assessed/performed Overall Cognitive Status: No family/caregiver present to determine baseline cognitive functioning (has h/o dementia, no one to confirm hx is accurate either)                      General Comments General comments (skin integrity, edema, etc.): HR 80s-110s.           Assessment/Plan    PT Assessment Patient needs continued PT services  PT Diagnosis Difficulty walking;Abnormality of gait;Generalized weakness;Altered mental status   PT Problem List Decreased strength;Decreased activity tolerance;Decreased balance;Decreased mobility;Decreased cognition;Decreased knowledge of use of DME;Decreased knowledge of precautions;Decreased safety awareness  PT Treatment Interventions DME instruction;Gait training;Stair training;Functional mobility training;Therapeutic activities;Therapeutic exercise;Balance training;Neuromuscular re-education;Cognitive remediation;Patient/family education   PT Goals (Current goals can be found in the Care Plan section) Acute Rehab PT Goals Patient Stated Goal: none stated PT Goal Formulation: Patient unable to participate in goal setting Time For Goal Achievement: 05/04/16 Potential to Achieve Goals: Good    Frequency Min 4X/week   Barriers to discharge Decreased caregiver support does not seem to currently have 24/7 assist at home    Co-evaluation PT/OT/SLP Co-Evaluation/Treatment: Yes Reason for Co-Treatment: For patient/therapist safety PT goals addressed during session: Mobility/safety with mobility;Balance;Proper use of DME;Strengthening/ROM OT goals addressed during session: ADL's and self-care       End of Session Equipment Utilized During Treatment: Gait belt Activity Tolerance: Patient tolerated treatment well Patient left: in chair;with call bell/phone within reach;with chair alarm set Nurse Communication: Mobility status          Time: [id number] PT Time Calculation (min) (ACUTE ONLY): 29 min   Charges:   PT Evaluation $PT Eval Moderate Complexity: 1 Procedure          Jaelin Devincentis B. Ronen Bromwell, PT, DPT [phone]   04/20/2016, 4:32 PM

## 2016-04-20 NOTE — Progress Notes (Signed)
STROKE TEAM PROGRESS NOTE   SUBJECTIVE (INTERVAL HISTORY) Patient sitting up in bed. Eating breakfast. No new complaints. Denies headache. Seems as Baseline dementia. PT/OT suggest 24/7 supervision. Transfer to floor.   OBJECTIVE Temp:  [97.4 F (36.3 C)-99.4 F (37.4 C)] 97.6 F (36.4 C) (08/14 0800) Pulse Rate:  [46-115] 70 (08/14 0700) Cardiac Rhythm: Atrial fibrillation (08/13 2000) Resp:  [7-23] 7 (08/14 0700) BP: (109-185)/(51-123) 159/84 (08/14 0700) SpO2:  [88 %-100 %] 96 % (08/14 0700)  CBC:   Recent Labs Lab 04/20/16 0356  WBC 7.1  HGB 13.3  HCT 40.7  MCV 96.7  PLT 188    Basic Metabolic Panel:   Recent Labs Lab 04/20/16 0356  NA 138  K 4.1  CL 105  CO2 25  GLUCOSE 173*  BUN 16  CREATININE 0.88  CALCIUM 8.3*    Lipid Panel:     Component Value Date/Time   CHOL 151 04/19/2016 0259   TRIG 51 04/19/2016 0259   HDL 45 04/19/2016 0259   CHOLHDL 3.4 04/19/2016 0259   VLDL 10 04/19/2016 0259   LDLCALC 96 04/19/2016 0259   HgbA1c: No results found for: HGBA1C   IMAGING I have personally reviewed the radiological images below and agree with the radiology interpretations.  Ct Head Wo Contrast 04/19/2016 Scattered areas of acute infarction in the right MCA territory. 5.5 cubic cm hematoma in the right parietal infarction region. Old left PCA territory infarction. Chronic small-vessel ischemic changes.  Mri and MRA Wo Contrast 04/19/2016  Areas of acute infarction within the right MCA territory. The largest region in the right parietal lobe measures about 3 cm in size and is associated with some blood products. This could be petechial bleeding or could be a frank hematoma. CT scan suggested for further evaluation. Old left PCA territory infarction. Chronic small-vessel ischemic changes elsewhere throughout the brain. No major vessel occlusion or correctable proximal stenosis. Distal vessel atherosclerotic irregularity diffusely.  Carotid  Doppler Bilateral: intimal wall thickening CCA. mild mixed plaque origin ICA. 1-39% ICA plaquing. Vertebral artery flow is antegrade.  2-D echocardiogram pending    PHYSICAL EXAM  Temp:  [97.4 F (36.3 C)-97.7 F (36.5 C)] 97.6 F (36.4 C) (08/14 1200) Pulse Rate:  [46-115] 78 (08/14 1604) Resp:  [7-25] 18 (08/14 1604) BP: (111-171)/(54-123) 153/86 (08/14 1604) SpO2:  [88 %-100 %] 100 % (08/14 1604)  General - Well nourished, well developed, in no apparent distress.  Ophthalmologic - Fundi not visualized due to noncooperation.  Cardiovascular - irregularly irregular heart rate and rhythm.  Mental Status -  Level of arousal and orientation to self, but not orientated to time, place, and age. Language including expression, naming, repetition, comprehension was assessed and found intact, mild dysarthria and psychomotor slowing.  Cranial Nerves II - XII - II - blinking to visual threat bilaterally. III, IV, VI - attending to both sides. V - Facial sensation intact bilaterally. VII - Facial movement intact bilaterally. VIII - hard of hearing & vestibular intact bilaterally. X - Palate exam not incorporated. XI - Chin turning & shoulder shrug intact bilaterally. XII - Tongue protrusion intact.  Motor Strength - The patient's strength was normal in all extremities and pronator drift was absent.  Bulk was normal and fasciculations were absent.   Motor Tone - Muscle tone was assessed at the neck and appendages and was normal.  Reflexes - The patient's reflexes were 1+ in all extremities and he had no pathological reflexes.  Sensory - Light touch, temperature/pinprick  were assessed and were symmetrical.    Coordination - right FTN dysmetria with postural tremor on the right hand.  Gait and Station - not tested for safety concerns.    ASSESSMENT/PLAN Barry Jones is a 82 y.o. male with history of atrial fibrillation (not anticoagulated ), diabetes mellitus, hypertension,  and dementia presenting with left-sided weakness and right gaze preference. He received [facility] at [facility] and [facility].  Stroke:  Scattered R MCA territory infarct with asymptomatic hemorrhagic transformation s/p IV tPA at outside hospital, strokes embolic secondary to atrial fibrillation without anticoagulation.   MRI  Right MCA territory infarcts with hemorrhagic transformation. Old left PCA infarct. small vessel disease.   MRA no large vessel occlusion  Post tPA Head CT - scattered right MCA infarcts with 5.85ml right parietal hematoma   Carotid Doppler no significant stenosis  2D Echo pending  LDL 96  HgbA1c 6.4  VTE prophylaxis -SCDs Diet Heart Room service appropriate? Yes; Fluid consistency: Thin  aspirin 81 mg daily prior to admission, now on No antithrombotic given post TPA hemorrhage  Ongoing aggressive stroke risk factor management  Therapy recommendations: 24/7 supervision  Disposition:  Pending  Transfer to the floor, tele  Atrial fibrillation  Not on anticoagulation at home  Persistent A. fib on telemetry  Resume atenolol  Rate controlled  No aspirin or anticoagulation now due to hemorrhagic transformation with hematoma  Hypertension  Stable  Long-term BP goal normotensive  Resume atenolol twice a day  Hyperlipidemia  Home meds:  No lipid lowering medications prior to admission  LDL 96, goal < 70  Added Lipitor 20 mg daily  Continue statin at discharge  Diabetes  HgbA1c 6.4, goal < 7.0  Controlled  SSI  CBG monitoring  Other Stroke Risk Factors  Advanced age  Atrial fibrillation not anticoagulated  Other Active Problems  Baseline dementia  Discontinue IV fluids  Hospital day # 2  This patient is critically ill due to right MCA infarct with hemorrhagic transformation, A. fib, status post TPA and at significant risk of neurological worsening, death form hematoma enlargement, recurrent  infarct, brain herniation, seizure, and heart failure. This patient's care requires constant monitoring of vital signs, hemodynamics, respiratory and cardiac monitoring, review of multiple databases, neurological assessment, discussion with family, other specialists and medical decision making of high complexity. I spent 35 minutes of neurocritical care time in the care of this patient.  Marvel PlanJindong Seda Kronberg, MD PhD Stroke Neurology 04/20/2016 6:42 PM     To contact Stroke Continuity provider, please refer to [email or web address]. After hours, contact General Neurology

## 2016-04-20 NOTE — Progress Notes (Signed)
*  PRELIMINARY RESULTS* Echocardiogram 2D Echocardiogram has been performed.  Barry Jones, Barry Jones 04/20/2016, 11:59 AM

## 2016-04-20 NOTE — Progress Notes (Signed)
OT Cancellation Note  Patient Details Name: Barry Jones MRN: [id number] DOB: 11/26/1930   Cancelled Treatment:    Reason Eval/Treat Not Completed: Patient not medically ready (strict bedrest) OT to await updated activity orders  Harolyn RutherfordJones, Diannah Rindfleisch B  [phone] 04/20/2016, 7:15 AM

## 2016-04-20 NOTE — Evaluation (Signed)
Occupational Therapy Evaluation Patient Details Name: Barry Jones MRN: [id number] DOB: 12/21/1930 Today's Date: 04/20/2016    History of Present Illness 82 y.o. male with a history of atrial fibrillation, diabetes mellitus, hypertension and dementia, brought to [facility] ED following acute onset of left-sided weakness,right side gaze. MRI areas of acute infarction within the right MCA territory. The largest region in the right parietal lobe measures about 3 cm in size and is associated with some blood products, old left PCA territory infarction. Pt received TPA which was administered.   Clinical Impression   Pt reports he was independent with ADL PTA. Currently pt overall min assist for functional mobility and min guard for ADL. Pt presenting with cognitive impairments and decreased balance in standing impacting independence and safety with ADL and functional mobility. Highly recommending 24/7 supervision upon return home, if family is unable to provide 24/7 supervision pt will need SNF upon d/c. Pt would benefit from continued skilled OT to address established goals.    Follow Up Recommendations  No OT follow up;Supervision/Assistance - 24 hour (will need SNF if family is unable to provide 24/7 S)    Equipment Recommendations  3 in 1 bedside comode    Recommendations for Other Services       Precautions / Restrictions Precautions Precautions: Fall Restrictions Weight Bearing Restrictions: No      Mobility Bed Mobility Overal bed mobility: Needs Assistance Bed Mobility: Supine to Sit     Supine to sit: Supervision     General bed mobility comments: Supervision for safety. No physical assist needed. HOB elevated with use of bed rails.  Transfers Overall transfer level: Needs assistance Equipment used: None Transfers: Sit to/from Stand Sit to Stand: Min guard         General transfer comment: Min guard for safety. Min assist for balance with dynamic  standing.    Balance Overall balance assessment: Needs assistance Sitting-balance support: Feet supported;No upper extremity supported Sitting balance-Leahy Scale: Good     Standing balance support: No upper extremity supported;During functional activity Standing balance-Leahy Scale: Fair                              ADL Overall ADL's : Needs assistance/impaired     Grooming: Min guard;Wash/dry hands;Standing;Cueing for sequencing   Upper Body Bathing: Set up;Supervision/ safety;Sitting   Lower Body Bathing: Min guard;Sit to/from stand   Upper Body Dressing : Set up;Supervision/safety;Sitting   Lower Body Dressing: Min guard;Sit to/from stand   Toilet Transfer: Minimal assistance;Ambulation;BSC;Grab bars   Toileting- Clothing Manipulation and Hygiene: Supervision/safety;Sitting/lateral lean Toileting - Clothing Manipulation Details (indicate cue type and reason): for toilet hygiene only     Functional mobility during ADLs: Minimal assistance General ADL Comments: Feel pt is fairly close to, if not at, baseline; no family present to confirm.     Vision Additional Comments: Required cueing to scan to L side during grooming activity to find paper towels. Otherwise WFL.   Perception     Praxis      Pertinent Vitals/Pain Pain Assessment: No/denies pain Faces Pain Scale: Hurts a little bit     Hand Dominance Right   Extremity/Trunk Assessment Upper Extremity Assessment Upper Extremity Assessment: Overall WFL for tasks assessed   Lower Extremity Assessment Lower Extremity Assessment: Defer to PT evaluation   Cervical / Trunk Assessment Cervical / Trunk Assessment: Kyphotic   Communication Communication Communication: HOH (wears bil hearing aids)   Cognition  Arousal/Alertness: Awake/alert Behavior During Therapy: WFL for tasks assessed/performed Overall Cognitive Status: No family/caregiver present to determine baseline cognitive functioning (feel  pt is fairly close to baseline)                     General Comments       Exercises       Shoulder Instructions      Home Living Family/patient expects to be discharged to:: Private residence Living Arrangements: Alone Available Help at Discharge: Family;Available PRN/intermittently (per chart review, family checks on pt 3x per day) Type of Home: House Home Access: Stairs to enter [employer] CorporationEntrance Stairs-Number of Steps: 2 Entrance Stairs-Rails: None       Bathroom Shower/Tub: Chief Strategy OfficerTub/shower unit   Bathroom Toilet: Handicapped height     Home Equipment: None      Lives With: Alone (family checks on 3x's a day)    Prior Functioning/Environment Level of Independence: Independent        Comments: Pt reports he still drives occasionally. Reports history of falls at home PTA. Pt reports he ambulates without AD, does laundry, eats out most of the time, cleans the house, children assist with yardwork.     OT Diagnosis: Generalized weakness;Cognitive deficits   OT Problem List: Impaired balance (sitting and/or standing);Impaired vision/perception;Decreased coordination;Decreased cognition;Decreased safety awareness;Decreased knowledge of use of DME or AE   OT Treatment/Interventions: Self-care/ADL training;Energy conservation;DME and/or AE instruction;Therapeutic activities;Patient/family education;Balance training    OT Goals(Current goals can be found in the care plan section) Acute Rehab OT Goals Patient Stated Goal: none stated OT Goal Formulation: With patient Time For Goal Achievement: 05/04/16 Potential to Achieve Goals: Good ADL Goals Pt Will Perform Grooming: with supervision;standing Pt Will Perform Upper Body Bathing: sitting;with set-up Pt Will Perform Lower Body Bathing: with supervision;sit to/from stand Pt Will Transfer to Toilet: with supervision;ambulating;regular height toilet Pt Will Perform Toileting - Clothing Manipulation and hygiene: with  supervision;sit to/from stand Pt Will Perform Tub/Shower Transfer: Tub transfer;with supervision;3 in 1;ambulating  OT Frequency: Min 2X/week   Barriers to D/C: Decreased caregiver support  pt lives alone with family checking in 3x per day       Co-evaluation PT/OT/SLP Co-Evaluation/Treatment: Yes Reason for Co-Treatment: For patient/therapist safety   OT goals addressed during session: ADL's and self-care      End of Session Equipment Utilized During Treatment: Gait belt Nurse Communication: Mobility status  Activity Tolerance: Patient tolerated treatment well Patient left: in chair;with call bell/phone within reach;with chair alarm set   Time: 1510-1538 OT Time Calculation (min): 28 min Charges:  OT General Charges $OT Visit: 1 Procedure OT Evaluation $OT Eval Moderate Complexity: 1 Procedure G-Codes:     Gaye AlkenBailey A Charlene Cowdrey M.S., OTR/L Pager: [phone]: [phone]  04/20/2016, 4:09 PM

## 2016-04-20 NOTE — Evaluation (Signed)
Speech Language Pathology Evaluation Patient Details Name: Barry Jones MRN: [id number] DOB: 09/01/1931 Today's Date: 04/20/2016 Time: 4098-11911204-1223 SLP Time Calculation (min) (ACUTE ONLY): 19 min  Problem List:  Patient Active Problem List   Diagnosis Date Noted  . CVA (cerebral infarction) 04/18/2016  . Essential (primary) hypertension 09/10/2015  . Local edema 09/10/2015  . Supraventricular tachycardia (HCC) 09/10/2015   Past Medical History: History reviewed. No pertinent past medical history. Past Surgical History: History reviewed. No pertinent surgical history. HPI:  82 y.o.malewith a history of atrial fibrillation, diabetes mellitus, hypertension and dementia, brought [facility] ED following acute onset of left-sided weakness,right side gaze. MRI areas of acute infarction within the right MCA territory. The largest region in the right parietal lobe measures about 3 cm in size and is associated with some blood products, old left PCA territory infarction. Pt received TPA which was administered. Per notes, NIH stroke score at the time of this evaluation was 6.   Assessment / Plan / Recommendation Clinical Impression  Pt appears close to baseline cognitive status. He lives alone with family checking 3 x's a day. Family manages medicine, finances and appointments. Son stated he is oriented to place, time at baseline and demonstrated mild-mod deficits during assessment. Recalled 2 of 3 words after 3 minutes and son says is remembering things related to home occuring prior to admission. Recalled some if information relayed by MD. He is not quite baseline (close) and ST will see pt for one session for compensatory strategies with pt/family.       SLP Assessment  Patient needs continued Speech Lanaguage Pathology Services    Follow Up Recommendations  24 hour supervision/assistance    Frequency and Duration min 1 x/week  1 week      SLP Evaluation Prior Functioning   Cognitive/Linguistic Baseline: Baseline deficits Baseline deficit details:  (memory) Type of Home: House  Lives With: Alone (family checks on 3x's a day) Available Help at Discharge: Family Vocation: Unemployed   Cognition  Overall Cognitive Status: Impaired/Different from baseline (very close to baseline) Arousal/Alertness: Awake/alert Orientation Level: Oriented to person;Disoriented to time;Oriented to situation;Oriented to place (son stated independent w/ ox prior) Attention: Sustained Sustained Attention: Appears intact Memory: Impaired Memory Impairment: Retrieval deficit Awareness: Impaired Awareness Impairment: Emergent impairment Problem Solving: Impaired Problem Solving Impairment: Verbal basic Behaviors:  (HOH) Safety/Judgment: Appears intact    Comprehension  Auditory Comprehension Overall Auditory Comprehension: Appears within functional limits for tasks assessed Visual Recognition/Discrimination Discrimination: Not tested Reading Comprehension Reading Status: Not tested    Expression Expression Primary Mode of Expression: Verbal Verbal Expression Overall Verbal Expression: Appears within functional limits for tasks assessed Initiation: No impairment Level of Generative/Spontaneous Verbalization: Sentence Repetition:  (NT) Naming: Not tested Pragmatics: No impairment Written Expression Dominant Hand: Right Written Expression: Not tested   Oral / Motor  Oral Motor/Sensory Function Overall Oral Motor/Sensory Function: Within functional limits Motor Speech Overall Motor Speech: Appears within functional limits for tasks assessed Respiration: Within functional limits Phonation: Normal Resonance: Within functional limits Articulation: Within functional limitis Intelligibility: Intelligible Motor Planning: Witnin functional limits   GO                    [name], [name] 04/20/2016, 3:05 PM   Breck CoonsLisa Willis Lillyahna Hemberger M.Ed [employer]-SLP Pager  [phone]

## 2016-04-21 ENCOUNTER — Other Ambulatory Visit: Payer: Self-pay | Admitting: Neurology

## 2016-04-21 DIAGNOSIS — I10 Essential (primary) hypertension: Secondary | ICD-10-CM

## 2016-04-21 DIAGNOSIS — I6389 Other cerebral infarction: Secondary | ICD-10-CM | POA: Insufficient documentation

## 2016-04-21 DIAGNOSIS — I482 Chronic atrial fibrillation: Secondary | ICD-10-CM

## 2016-04-21 DIAGNOSIS — E119 Type 2 diabetes mellitus without complications: Secondary | ICD-10-CM

## 2016-04-21 DIAGNOSIS — E785 Hyperlipidemia, unspecified: Secondary | ICD-10-CM | POA: Diagnosis present

## 2016-04-21 DIAGNOSIS — F0390 Unspecified dementia without behavioral disturbance: Secondary | ICD-10-CM | POA: Diagnosis present

## 2016-04-21 DIAGNOSIS — I63411 Cerebral infarction due to embolism of right middle cerebral artery: Secondary | ICD-10-CM

## 2016-04-21 DIAGNOSIS — E1159 Type 2 diabetes mellitus with other circulatory complications: Secondary | ICD-10-CM

## 2016-04-21 DIAGNOSIS — I4891 Unspecified atrial fibrillation: Secondary | ICD-10-CM | POA: Diagnosis present

## 2016-04-21 LAB — CBC
HEMATOCRIT: 42.9 % (ref 39.0–52.0)
Hemoglobin: 13.8 g/dL (ref 13.0–17.0)
MCH: 31.4 pg (ref 26.0–34.0)
MCHC: 32.2 g/dL (ref 30.0–36.0)
MCV: 97.7 fL (ref 78.0–100.0)
Platelets: 185 10*3/uL (ref 150–400)
RBC: 4.39 MIL/uL (ref 4.22–5.81)
RDW: 14.8 % (ref 11.5–15.5)
WBC: 7.9 10*3/uL (ref 4.0–10.5)

## 2016-04-21 LAB — BASIC METABOLIC PANEL
ANION GAP: 6 (ref 5–15)
BUN: 19 mg/dL (ref 6–20)
CALCIUM: 8.8 mg/dL — AB (ref 8.9–10.3)
CHLORIDE: 104 mmol/L (ref 101–111)
CO2: 28 mmol/L (ref 22–32)
Creatinine, Ser: 0.89 mg/dL (ref 0.61–1.24)
GFR calc Af Amer: >60 mL/min (ref >60)
GFR calc non Af Amer: >60 mL/min (ref >60)
GLUCOSE: 144 mg/dL — AB (ref 65–99)
Potassium: 4.5 mmol/L (ref 3.5–5.1)
Sodium: 138 mmol/L (ref 135–145)

## 2016-04-21 LAB — GLUCOSE, CAPILLARY
GLUCOSE-CAPILLARY: 116 mg/dL — AB (ref 65–99)
Glucose-Capillary: 154 mg/dL — ABNORMAL HIGH (ref 65–99)

## 2016-04-21 MED ORDER — ATORVASTATIN CALCIUM 20 MG PO TABS: 20.0000 mg | ORAL_TABLET | Freq: Every day | ORAL | 2 refills | Status: AC

## 2016-04-21 NOTE — Care Management Note (Addendum)
Case Management Note  Patient Details  Name: Barry Jones MRN: [id number] Date of Birth: [birth year]  Subjective/Objective:                    Action/Plan: Pt discharging home with orders for [facility] services. CM met with the patient and his son and provided them a list of [place] agencies in the [facility] area. The son selected [facility]. Karolee Stamps with [facility] notified and accepted the referral. Pt also ordered a 3 in [place] with [facility] DME notified and will deliver the equipment to the room. Bedside RN updated.   Expected Discharge Date:                  Expected Discharge Plan:  [place]  In-House Referral:     Discharge planning Services  CM Consult  Post Acute Care Choice:  Home Health Choice offered to:  Adult Children  DME Arranged:    DME Agency:     HH Arranged:  PT HH Agency:  [place]  Status of Service:  Completed, signed off  If discussed at [place] of Stay Meetings, dates discussed:    Additional Comments:  Pollie Friar, RN 04/21/2016, 11:03 AM

## 2016-04-21 NOTE — Discharge Instructions (Signed)
Stroke Prevention °Some health problems and behaviors may make it more likely for you to have a stroke. Below are ways to lessen your risk of having a stroke.  °· Be active for at least 30 minutes on most or all days. °· Do not smoke. Try not to be around others who smoke. °· Do not drink too much alcohol. °¨ Do not have more than 2 drinks a day if you are a man. °¨ Do not have more than 1 drink a day if you are a woman and are not pregnant. °· Eat healthy foods, such as fruits and vegetables. If you were put on a specific diet, follow the diet as told. °· Keep your cholesterol levels under control through diet and medicines. Look for foods that are low in saturated fat, trans fat, cholesterol, and are high in fiber. °· If you have diabetes, follow all diet plans and take your medicine as told. °· Ask your doctor if you need treatment to lower your blood pressure. If you have high blood pressure (hypertension), follow all diet plans and take your medicine as told by your doctor. °· If you are 18-39 years old, have your blood pressure checked every 3-5 years. If you are age 40 or older, have your blood pressure checked every year. °· Keep a healthy weight. Eat foods that are low in calories, salt, saturated fat, trans fat, and cholesterol. °· Do not take drugs. °· Avoid birth control pills, if this applies. Talk to your doctor about the risks of taking birth control pills. °· Talk to your doctor if you have sleep problems (sleep apnea). °· Take all medicine as told by your doctor. °¨ You may be told to take aspirin or blood thinner medicine. Take this medicine as told by your doctor. °¨ Understand your medicine instructions. °· Make sure any other conditions you have are being taken care of. °GET HELP RIGHT AWAY IF: °· You suddenly lose feeling (you feel numb) or have weakness in your face, arm, or leg. °· Your face or eyelid hangs down to one side. °· You suddenly feel confused. °· You have trouble talking (aphasia)  or understanding what people are saying. °· You suddenly have trouble seeing in one or both eyes. °· You suddenly have trouble walking. °· You are dizzy. °· You lose your balance or your movements are clumsy (uncoordinated). °· You suddenly have a very bad headache and you do not know the cause. °· You have new chest pain. °· Your heart feels like it is fluttering or skipping a beat (irregular heartbeat). °Do not wait to see if the symptoms above go away. Get help right away. Call your local emergency services (911 in [place] Do not drive yourself to the hospital. °  °This information is not intended to replace advice given to you by your health care provider. Make sure you discuss any questions you have with your health care provider. °  °Document Released: 02/23/2012 Document Revised: 09/14/2014 Document Reviewed: 02/24/2013 °Elsevier Interactive Patient Education ©2016 Elsevier Inc. ° °

## 2016-04-21 NOTE — Discharge Summary (Signed)
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  past medical history Hypertension Diabetes mellitus Atrial fibrillation Dementia Hearing loss  No past surgical history on file    Medication List    STOP taking these medications   aspirin EC 81 MG tablet     TAKE these medications   atenolol 25 MG tablet Commonly known as:  TENORMIN Take 25 mg by mouth 3 (three) times daily.   atorvastatin 20 MG tablet Commonly known as:  LIPITOR Take 1 tablet (20 mg total) by mouth daily at 6 PM.   B-12 PO Take 1 tablet by mouth daily.   metFORMIN 500 MG tablet Commonly known as:  GLUCOPHAGE Take 500 mg by mouth 2 (two) times daily with a meal.       LABORATORY STUDIES CBC    Component Value Date/Time   WBC 7.9 04/21/2016 0817   RBC 4.39 04/21/2016 0817   HGB 13.8 04/21/2016 0817   HCT 42.9 04/21/2016 0817   PLT 185 04/21/2016 0817   MCV 97.7 04/21/2016 0817   MCH 31.4 04/21/2016 0817   MCHC 32.2 04/21/2016 0817   RDW 14.8 04/21/2016 0817   CMP    Component Value Date/Time   NA 138 04/21/2016 0817   K 4.5 04/21/2016 0817   CL 104 04/21/2016 0817   CO2 28 04/21/2016 0817   GLUCOSE 144 (H) 04/21/2016 0817   BUN 19 04/21/2016 0817   CREATININE 0.89 04/21/2016 0817   CALCIUM 8.8 (L) 04/21/2016 0817   GFRNONAA >60 04/21/2016 0817   GFRAA >60 04/21/2016 0817   Lipid Panel    Component Value Date/Time   CHOL 151 04/19/2016 0259   TRIG 51  04/19/2016 0259   HDL 45 04/19/2016 0259   CHOLHDL 3.4 04/19/2016 0259   VLDL 10 04/19/2016 0259   LDLCALC 96 04/19/2016 0259   HgbA1C  Lab Results  Component Value Date   HGBA1C 6.4 (H) 04/19/2016    SIGNIFICANT DIAGNOSTIC STUDIES Ct Head Wo Contrast 04/19/2016 Scattered areas of acute infarction in the right MCA territory. 5.5 cubic cm hematoma in the right parietal infarction region. Old left PCA territory infarction. Chronic small-vessel ischemic changes.  Mri and MRA Wo Contrast 04/19/2016  Areas of acute infarction within the right MCA territory. The largest region in the right parietal lobe measures about 3 cm in size and is associated with some blood products. This could be petechial bleeding or could be a frank hematoma. CT scan suggested for further evaluation. Old left PCA territory infarction. Chronic small-vessel ischemic changes elsewhere throughout the brain. No major vessel occlusion or correctable proximal stenosis. Distal vessel atherosclerotic irregularity diffusely.  Carotid Doppler Bilateral: intimal wall thickening CCA. mild mixed plaque origin ICA. 1-39% ICA plaquing. Vertebral artery flow is antegrade.  2-D echocardiogram - Left ventricle: The cavity size was normal. Wall thickness was increased in a pattern of mild LVH. The estimated ejection fraction was 55%.  Wall motion was normal; there were no regional wall motion abnormalities. Indeterminant diastolic function (atrial fibrillation). - Aortic valve: There was no stenosis. There was mild regurgitation. - Mitral valve: Mildly calcified annulus. There was no significant regurgitation. - Left atrium: The atrium was severely dilated. - Right ventricle: The cavity size was normal. Systolic function was normal. - Right atrium: The atrium was severely dilated. - Tricuspid valve: There was moderate regurgitation. - Pulmonary arteries: PA peak pressure: 44 mm Hg (S). - Systemic veins: IVC measured 2.6 cm with <  50% respirophasic variation, suggesting RA pressure 15 mmHg. Impressions:   The patient was in atrial fibrillation. Normal LV size with mild LV hypertrophy. EF 55%. Normal RV size and systolic function. Severe biatrial enlargement. Mild aortic insufficiency. Moderate TR. Mild pulmonary hypertension. Dilated IVC.      HISTORY OF PRESENT ILLNESS Kell Hooveris an 82 y.o.malewith a history of atrial fibrillation, diabetes mellitus, hypertension and dementia, brought to [facility] ED following acute onset of left-sided weakness at 1400 today. Patient was noted to have gaze to his right side, and addition to left side weakness. CT scan of his head showed no acute intracranial abnormality. He was deemed a candidate for TPA which was administered. He has shown improvement in of left extremities following TPA administration. He still has gaze to the right side. Blood pressures been elevated, requiring intermittent treatment with IV labetalol. NIH stroke score at the time of this evaluation was 6. He was LKW at2:00 PM on 04/18/2016. He was given tPA and admitted to the neuro ICU for further evaluation and treatment.     HOSPITAL COURSE Mr. Barry Jones is a 82 y.o. male with history of atrial fibrillation (not anticoagulated ), diabetes mellitus, hypertension, and dementia presenting with left-sided weakness and right gaze preference. He received IV t-PA at [facility] and transferred to [facility] for admission.  Stroke:  Scattered R MCA territory infarct with asymptomatic hemorrhagic transformation s/p IV tPA at outside hospital, strokes embolic secondary to atrial fibrillation without anticoagulation.   MRI  Right MCA territory infarcts with hemorrhagic transformation. Old left PCA infarct. small vessel disease.   MRA no large vessel occlusion  Post tPA Head CT - scattered right MCA infarcts with 5.89ml right parietal hematoma   Carotid Doppler no significant stenosis  2D Echo EF 55%. No  source of embolus   LDL 96  HgbA1c 6.4  aspirin 81 mg daily prior to admission, now on No antithrombotic given post TPA hemorrhage  Repeat CT head in 3 weeks. If hemorrhage resolved, add eliquis at that time.  Ongoing aggressive stroke risk factor management by PCP  Therapy recommendations: 24/7 supervision, HH PT  Disposition:  d/c home   Atrial fibrillation  Not on anticoagulation at home  Persistent A. fib on telemetry  Resume atenolol  Rate controlled  No aspirin or anticoagulation now due to hemorrhagic transformation with hematoma  See plan for eliquis in the future above  Hypertension  Stable  Resume atenolol twice a day  Long-term BP goal normotensive  Hyperlipidemia  Home meds:  No lipid lowering medications prior to admission  LDL 96, goal < 70  Added Lipitor 20 mg daily  Continue statin at discharge  Diabetes  HgbA1c 6.4, goal < 7.0  Controlled  Other Stroke Risk Factors  Advanced age  Other Active Problems  Baseline dementia   DISCHARGE EXAM Blood pressure (!) 165/90, pulse 91, temperature 97.9 F (36.6 C), temperature source Oral, resp. rate 20, SpO2 97 %.  General - Well nourished, well developed, in no apparent distress.  Ophthalmologic - Fundi not visualized due to noncooperation.  Cardiovascular - irregularly irregular heart rate and rhythm.  Mental Status -  Level of arousal and orientation to self, but not orientated to time, place, and age. Language including expression, naming, repetition, comprehension was assessed and found intact, mild dysarthria and psychomotor slowing.  Cranial Nerves II - XII - II - blinking to visual threat bilaterally. III, IV, VI - attending to both sides. V - Facial sensation intact bilaterally. VII - Facial movement intact bilaterally. VIII - hard of hearing & vestibular intact bilaterally. X - Palate exam not incorporated. XI - Chin turning & shoulder shrug intact  bilaterally. XII - Tongue protrusion intact.  Motor Strength - The patient's strength was normal in all extremities and pronator drift was absent.  Bulk was normal and fasciculations were absent.   Motor Tone - Muscle tone was assessed at the neck and appendages and was normal.  Reflexes - The patient's reflexes were 1+ in all extremities and he had no pathological reflexes.  Sensory - Light touch, temperature/pinprick were assessed and were symmetrical.    Coordination - right FTN dysmetria with postural tremor on the right hand.  Gait and Station - not tested for safety concerns.   Discharge Diet   Diet heart healthy/carb modified Room service appropriate? Yes; Fluid consistency: Thin liquids  DISCHARGE PLAN  Disposition:  Home with HH PT  No antithrombotic for secondary stroke prevention given post tPA hemorrhage  Repeat CT head in 3 weeks. If hemorrhage resolved, add Eliquis at that time.  Ongoing risk factor control by Primary Care Physician at time of discharge  Follow-up SCHULTZ,DOUGLAS E, MD in 2 weeks.  Follow-up with Dr. Marvel PlanJindong Adreanna Fickel, Stroke Clinic in 2 months, office to schedule an appointment.  40 minutes were spent preparing discharge.  Rhoderick MoodyBIBY,SHARON  [facility] See Amion for Pager information 04/21/2016 10:49 AM    I, the attending vascular neurologist, have personally obtained a history, examined the patient, evaluated laboratory data, individually viewed imaging studies and agree with radiology interpretations. Together with the NP/PA, we formulated the assessment and plan of care which reflects our mutual decision.  I have made any additions or clarifications directly to the above note and agree with the findings and plan as currently documented.   Pt recovered well from the stroke and asymptomatic hemorrhagic transformation. PT/OT recommend 24/7 supervision. Will dd/c home with home PT. Son is able to provide 24/7 supervision. Will schedule for  CT in 3 weeks and then consider to start eliquis for afib stroke prevention.   Marvel PlanJindong Adekunle Rohrbach, MD PhD Stroke Neurology 04/21/2016 11:10 PM

## 2016-04-21 NOTE — Progress Notes (Signed)
Pt d/c to home by car with son. Assessment stable. All questions answered

## 2016-04-21 NOTE — Care Management Important Message (Signed)
Important Message  Patient Details  Name: Cletus GashDallas Chaudhary MRN: [id number] Date of Birth: 12/31/1930   Medicare Important Message Given:  Yes    Sylvia Kondracki Stefan ChurchBratton 04/21/2016, 10:52 AM

## 2016-04-22 DIAGNOSIS — E118 Type 2 diabetes mellitus with unspecified complications: Secondary | ICD-10-CM | POA: Diagnosis not present

## 2016-04-22 DIAGNOSIS — I63411 Cerebral infarction due to embolism of right middle cerebral artery: Secondary | ICD-10-CM | POA: Diagnosis not present

## 2016-04-22 DIAGNOSIS — R262 Difficulty in walking, not elsewhere classified: Secondary | ICD-10-CM | POA: Diagnosis not present

## 2016-04-22 DIAGNOSIS — R278 Other lack of coordination: Secondary | ICD-10-CM | POA: Diagnosis not present

## 2016-04-22 DIAGNOSIS — I10 Essential (primary) hypertension: Secondary | ICD-10-CM | POA: Diagnosis not present

## 2016-04-24 DIAGNOSIS — R278 Other lack of coordination: Secondary | ICD-10-CM | POA: Diagnosis not present

## 2016-04-24 DIAGNOSIS — E118 Type 2 diabetes mellitus with unspecified complications: Secondary | ICD-10-CM | POA: Diagnosis not present

## 2016-04-24 DIAGNOSIS — I63411 Cerebral infarction due to embolism of right middle cerebral artery: Secondary | ICD-10-CM | POA: Diagnosis not present

## 2016-04-24 DIAGNOSIS — I10 Essential (primary) hypertension: Secondary | ICD-10-CM | POA: Diagnosis not present

## 2016-04-24 DIAGNOSIS — R262 Difficulty in walking, not elsewhere classified: Secondary | ICD-10-CM | POA: Diagnosis not present

## 2016-04-27 DIAGNOSIS — I10 Essential (primary) hypertension: Secondary | ICD-10-CM | POA: Diagnosis not present

## 2016-04-27 DIAGNOSIS — I63411 Cerebral infarction due to embolism of right middle cerebral artery: Secondary | ICD-10-CM | POA: Diagnosis not present

## 2016-04-27 DIAGNOSIS — R262 Difficulty in walking, not elsewhere classified: Secondary | ICD-10-CM | POA: Diagnosis not present

## 2016-04-27 DIAGNOSIS — R278 Other lack of coordination: Secondary | ICD-10-CM | POA: Diagnosis not present

## 2016-04-30 DIAGNOSIS — E118 Type 2 diabetes mellitus with unspecified complications: Secondary | ICD-10-CM | POA: Diagnosis not present

## 2016-04-30 DIAGNOSIS — R262 Difficulty in walking, not elsewhere classified: Secondary | ICD-10-CM | POA: Diagnosis not present

## 2016-04-30 DIAGNOSIS — R278 Other lack of coordination: Secondary | ICD-10-CM | POA: Diagnosis not present

## 2016-04-30 DIAGNOSIS — I10 Essential (primary) hypertension: Secondary | ICD-10-CM | POA: Diagnosis not present

## 2016-04-30 DIAGNOSIS — I63411 Cerebral infarction due to embolism of right middle cerebral artery: Secondary | ICD-10-CM | POA: Diagnosis not present

## 2016-05-01 DIAGNOSIS — I63411 Cerebral infarction due to embolism of right middle cerebral artery: Secondary | ICD-10-CM | POA: Diagnosis not present

## 2016-05-01 DIAGNOSIS — R278 Other lack of coordination: Secondary | ICD-10-CM | POA: Diagnosis not present

## 2016-05-01 DIAGNOSIS — I4891 Unspecified atrial fibrillation: Secondary | ICD-10-CM | POA: Diagnosis not present

## 2016-05-01 DIAGNOSIS — R262 Difficulty in walking, not elsewhere classified: Secondary | ICD-10-CM | POA: Diagnosis not present

## 2016-05-01 DIAGNOSIS — E118 Type 2 diabetes mellitus with unspecified complications: Secondary | ICD-10-CM | POA: Diagnosis not present

## 2016-05-01 DIAGNOSIS — N390 Urinary tract infection, site not specified: Secondary | ICD-10-CM | POA: Diagnosis not present

## 2016-05-01 DIAGNOSIS — I10 Essential (primary) hypertension: Secondary | ICD-10-CM | POA: Diagnosis not present

## 2016-05-01 DIAGNOSIS — E785 Hyperlipidemia, unspecified: Secondary | ICD-10-CM | POA: Diagnosis not present

## 2016-05-05 DIAGNOSIS — R262 Difficulty in walking, not elsewhere classified: Secondary | ICD-10-CM | POA: Diagnosis not present

## 2016-05-05 DIAGNOSIS — I63411 Cerebral infarction due to embolism of right middle cerebral artery: Secondary | ICD-10-CM | POA: Diagnosis not present

## 2016-05-05 DIAGNOSIS — E118 Type 2 diabetes mellitus with unspecified complications: Secondary | ICD-10-CM | POA: Diagnosis not present

## 2016-05-05 DIAGNOSIS — R278 Other lack of coordination: Secondary | ICD-10-CM | POA: Diagnosis not present

## 2016-05-05 DIAGNOSIS — I10 Essential (primary) hypertension: Secondary | ICD-10-CM | POA: Diagnosis not present

## 2016-05-07 DIAGNOSIS — E118 Type 2 diabetes mellitus with unspecified complications: Secondary | ICD-10-CM | POA: Diagnosis not present

## 2016-05-07 DIAGNOSIS — R278 Other lack of coordination: Secondary | ICD-10-CM | POA: Diagnosis not present

## 2016-05-07 DIAGNOSIS — R262 Difficulty in walking, not elsewhere classified: Secondary | ICD-10-CM | POA: Diagnosis not present

## 2016-05-07 DIAGNOSIS — I63411 Cerebral infarction due to embolism of right middle cerebral artery: Secondary | ICD-10-CM | POA: Diagnosis not present

## 2016-05-07 DIAGNOSIS — I10 Essential (primary) hypertension: Secondary | ICD-10-CM | POA: Diagnosis not present

## 2016-05-08 ENCOUNTER — Other Ambulatory Visit: Payer: Self-pay

## 2016-05-08 NOTE — Patient Outreach (Signed)
[facility] Care Management  05/08/2016  Barry GashDallas Jones 01/22/1931 [id number]       EMMI-Stroke RED ON EMMI ALERT Day # 13 Date: 05/07/16 Red Alert Reason: "Feeling worse overall? Yes"    Outreach attempt #1 to patient. A male answered the phone and reported that patient was no available. RN CM left name and contact info with caller for patient to return call.    Plan: RN CM will make outreach attempt to patient within a week if no return call from patient.    Barry Fairyoshanda Vignesh Willert, RN,BSN,CCM [facility] Care Management Telephonic Care Management Coordinator Direct Phone: [phone] Toll Free: [phone] Fax: [phone]

## 2016-05-12 ENCOUNTER — Other Ambulatory Visit: Payer: Self-pay

## 2016-05-12 ENCOUNTER — Ambulatory Visit
Admission: RE | Admit: 2016-05-12 | Discharge: 2016-05-12 | Disposition: A | Discharge status: Home / Self Care | Payer: Medicare Other | Source: Ambulatory Visit | Attending: Neurology | Admitting: Neurology

## 2016-05-12 DIAGNOSIS — I63411 Cerebral infarction due to embolism of right middle cerebral artery: Secondary | ICD-10-CM

## 2016-05-12 DIAGNOSIS — I6389 Other cerebral infarction: Secondary | ICD-10-CM

## 2016-05-12 DIAGNOSIS — I638 Other cerebral infarction: Secondary | ICD-10-CM | POA: Diagnosis not present

## 2016-05-12 NOTE — Patient Outreach (Signed)
[facility] Care Management  05/12/2016  Barry GashDallas Hulet 03/22/1931 [id number]   EMMI-Stroke RED ON EMMI ALERT Day # 13 Date: 05/07/16 Red Alert Reason: "Feeling worse overall? Yes"    Outreach attempt #2 to patient. Spoke with patient/son. Discussed and reviewed red alert. Advised that that was a mistake and patient did not mean to respond that way. Patient doing well since discharge per report. He is currently being treated for a UTI but otherwise doing fine. F/U appts in place. No issues with transportation or meds. No further RN CM needs or concerns at this time. Informed patient that automated EMMI Stroke calls would continue and if any response is outside of the norm it will trigger a call from a live nurse to follow up.     Plan: RN CM will notify [facility] administrative assistant of case closure status.  Antionette Fairyoshanda Quintasha Gren, RN,BSN,CCM [facility] Care Management Telephonic Care Management Coordinator Direct Phone: [phone] Toll Free: [phone] Fax: [phone]

## 2016-05-13 DIAGNOSIS — R278 Other lack of coordination: Secondary | ICD-10-CM | POA: Diagnosis not present

## 2016-05-13 DIAGNOSIS — I10 Essential (primary) hypertension: Secondary | ICD-10-CM | POA: Diagnosis not present

## 2016-05-13 DIAGNOSIS — R262 Difficulty in walking, not elsewhere classified: Secondary | ICD-10-CM | POA: Diagnosis not present

## 2016-05-13 DIAGNOSIS — E118 Type 2 diabetes mellitus with unspecified complications: Secondary | ICD-10-CM | POA: Diagnosis not present

## 2016-05-13 DIAGNOSIS — I63411 Cerebral infarction due to embolism of right middle cerebral artery: Secondary | ICD-10-CM | POA: Diagnosis not present

## 2016-05-15 ENCOUNTER — Telehealth: Payer: Self-pay | Admitting: Neurology

## 2016-05-15 DIAGNOSIS — H6122 Impacted cerumen, left ear: Secondary | ICD-10-CM | POA: Diagnosis not present

## 2016-05-15 DIAGNOSIS — I4891 Unspecified atrial fibrillation: Secondary | ICD-10-CM | POA: Diagnosis not present

## 2016-05-15 DIAGNOSIS — Z6822 Body mass index (BMI) 22.0-22.9, adult: Secondary | ICD-10-CM | POA: Diagnosis not present

## 2016-05-15 DIAGNOSIS — I63411 Cerebral infarction due to embolism of right middle cerebral artery: Secondary | ICD-10-CM | POA: Diagnosis not present

## 2016-05-15 DIAGNOSIS — I10 Essential (primary) hypertension: Secondary | ICD-10-CM | POA: Diagnosis not present

## 2016-05-15 NOTE — Telephone Encounter (Signed)
Called Barry Jones and delivered CT result to him. His hematoma has been absorbed and he is OK to start anticoagulation with eliquis for stroke prevention. He understood and his PCP is going to prescribe eliquis for him. He already got the one month free card which is awesome. I asked Barry Jones to contact Dr. Tomasa BlaseSchultz for eliquis. I will also forward note to Dr. Tomasa BlaseSchultz. Barry Jones expressed understanding and appreciation.   Marvel PlanJindong Sean Macwilliams, MD PhD Stroke Neurology 05/15/2016 12:18 PM

## 2016-05-21 DIAGNOSIS — N390 Urinary tract infection, site not specified: Secondary | ICD-10-CM | POA: Diagnosis not present

## 2016-05-21 DIAGNOSIS — Z6823 Body mass index (BMI) 23.0-23.9, adult: Secondary | ICD-10-CM | POA: Diagnosis not present

## 2016-06-05 ENCOUNTER — Encounter: Payer: Self-pay | Admitting: Sports Medicine

## 2016-06-05 ENCOUNTER — Ambulatory Visit (INDEPENDENT_AMBULATORY_CARE_PROVIDER_SITE_OTHER): Payer: Medicare Other | Admitting: Sports Medicine

## 2016-06-05 DIAGNOSIS — B351 Tinea unguium: Secondary | ICD-10-CM

## 2016-06-05 DIAGNOSIS — L84 Corns and callosities: Secondary | ICD-10-CM

## 2016-06-05 DIAGNOSIS — E1142 Type 2 diabetes mellitus with diabetic polyneuropathy: Secondary | ICD-10-CM | POA: Diagnosis not present

## 2016-06-05 DIAGNOSIS — M79672 Pain in left foot: Secondary | ICD-10-CM | POA: Diagnosis not present

## 2016-06-05 DIAGNOSIS — Q828 Other specified congenital malformations of skin: Secondary | ICD-10-CM

## 2016-06-05 DIAGNOSIS — I739 Peripheral vascular disease, unspecified: Secondary | ICD-10-CM

## 2016-06-05 DIAGNOSIS — M79671 Pain in right foot: Secondary | ICD-10-CM | POA: Diagnosis not present

## 2016-06-05 NOTE — Progress Notes (Signed)
Patient ID: Barry Jones, male   DOB: 1931-01-12, 82 y.o.   MRN: [id number]   Subjective: Barry Jones is a 82 y.o. male patient with history of type 2 diabetes who presents to office today complaining of callus and long, painful nails  while ambulating in shoes; unable to trim. Patient is assisted by son who states that the glucose reading this morning was 116; reports that dad had a stroke > 1.5 month ago. No other issues.    Patient Active Problem List   Diagnosis Date Noted  . Atrial fibrillation ([place]) 04/21/2016  . Dementia 04/21/2016  . Hyperlipidemia LDL goal <70 04/21/2016  . Diabetes mellitus type II, controlled ([place]) 04/21/2016  . Anterior cerebral circulation hemorrhagic infarction ([place]) 04/21/2016  . Embolic cerebral infarction ([place]) - R MCA s/p IV tPA 04/18/2016  . Essential (primary) hypertension 09/10/2015  . Local edema 09/10/2015  . Supraventricular tachycardia ([place]) 09/10/2015   Current Outpatient Prescriptions on File Prior to Visit  Medication Sig Dispense Refill  . atenolol (TENORMIN) 25 MG tablet Take 25 mg by mouth 3 (three) times daily.     Marland Kitchen atorvastatin (LIPITOR) 20 MG tablet Take 1 tablet (20 mg total) by mouth daily at 6 PM. 30 tablet 2  . Cyanocobalamin (B-12 PO) Take 1 tablet by mouth daily.    . metFORMIN (GLUCOPHAGE) 500 MG tablet Take 500 mg by mouth 2 (two) times daily with a meal.     No current facility-administered medications on file prior to visit.    Allergies  Allergen Reactions  . Ciprofloxacin Other (See Comments)    Made pt crazy    Objective: General: Patient is awake, alert, and oriented x 3 and in no acute distress.  Integument: Skin is warm, dry and supple bilateral. Nails are tender, long, thickened and  dystrophic with subungual debris, consistent with onychomycosis, 1-5 bilateral. No signs of infection. No open lesions. + callus with dry blood sub met 1 and 5 present bilateral with no infection. Remaining integument  unremarkable.  Vasculature:  Dorsalis Pedis pulse 1/4 bilateral. Posterior Tibial pulse  0/4 bilateral.  Capillary fill time <4 sec 1-5 bilateral. No hair growth to the level of the digits. Temperature gradient within normal limits. +varicosities present bilateral. 1+ pitting edema present bilateral ankles.   Neurology: The patient has diminished sensation measured with a 5.07/10g Semmes Weinstein Monofilament at all pedal sites bilateral . Vibratory sensation diminished bilateral with tuning fork. No Babinski sign present bilateral.   Musculoskeletal: No gross pedal deformities noted bilateral. Muscular strength 5/5 in all lower extremity muscular groups bilateral without pain on range of motion . No tenderness with calf compression bilateral.  Assessment and Plan: Problem List Items Addressed This Visit    None    Visit Diagnoses    Dermatophytosis of nail    -  Primary   Callus of foot       PVD (peripheral vascular disease) ([place])       Diabetic polyneuropathy associated with type 2 diabetes mellitus (HCC)       Foot pain, bilateral         -Examined patient. -Discussed and educated patient on diabetic foot care, especially with regards to the vascular, neurological and musculoskeletal systems.  -Stressed the importance of good glycemic control and the detriment of not controlling glucose levels in relation to the foot. -Mechanically debrided callus using sterile chisel blade and all nails 1-5 bilateral using sterile nail nipper and filed with dremel without incident  -Recommend  elevation of legs daily to assist with edema control -Recommend good supportive shoes daily  -Answered all patient questions -Patient to return in 3 months for at risk foot care -Patient advised to call the office if any problems or questions arise in the meantime.  Barry Jones, DPM

## 2016-06-15 DIAGNOSIS — Z6823 Body mass index (BMI) 23.0-23.9, adult: Secondary | ICD-10-CM | POA: Diagnosis not present

## 2016-06-15 DIAGNOSIS — R21 Rash and other nonspecific skin eruption: Secondary | ICD-10-CM | POA: Diagnosis not present

## 2016-06-15 DIAGNOSIS — I872 Venous insufficiency (chronic) (peripheral): Secondary | ICD-10-CM | POA: Diagnosis not present

## 2016-06-15 DIAGNOSIS — N390 Urinary tract infection, site not specified: Secondary | ICD-10-CM | POA: Diagnosis not present

## 2016-06-21 ENCOUNTER — Inpatient Hospital Stay (HOSPITAL_COMMUNITY)
Admission: AD | Admit: 2016-06-21 | Discharge: 2016-06-29 | DRG: 101 | Disposition: A | Discharge status: Skilled Nursing Facility | Payer: Medicare Other | Source: Other Acute Inpatient Hospital | Attending: Family Medicine | Admitting: Family Medicine

## 2016-06-21 ENCOUNTER — Inpatient Hospital Stay (HOSPITAL_COMMUNITY): Payer: Medicare Other

## 2016-06-21 ENCOUNTER — Encounter (HOSPITAL_COMMUNITY): Payer: Self-pay | Admitting: Internal Medicine

## 2016-06-21 DIAGNOSIS — E785 Hyperlipidemia, unspecified: Secondary | ICD-10-CM

## 2016-06-21 DIAGNOSIS — E7800 Pure hypercholesterolemia, unspecified: Secondary | ICD-10-CM | POA: Diagnosis not present

## 2016-06-21 DIAGNOSIS — R2689 Other abnormalities of gait and mobility: Secondary | ICD-10-CM | POA: Diagnosis not present

## 2016-06-21 DIAGNOSIS — E119 Type 2 diabetes mellitus without complications: Secondary | ICD-10-CM | POA: Diagnosis present

## 2016-06-21 DIAGNOSIS — G9389 Other specified disorders of brain: Secondary | ICD-10-CM | POA: Diagnosis not present

## 2016-06-21 DIAGNOSIS — R410 Disorientation, unspecified: Secondary | ICD-10-CM | POA: Diagnosis present

## 2016-06-21 DIAGNOSIS — Z79899 Other long term (current) drug therapy: Secondary | ICD-10-CM

## 2016-06-21 DIAGNOSIS — Z8673 Personal history of transient ischemic attack (TIA), and cerebral infarction without residual deficits: Secondary | ICD-10-CM | POA: Diagnosis not present

## 2016-06-21 DIAGNOSIS — I10 Essential (primary) hypertension: Secondary | ICD-10-CM | POA: Diagnosis not present

## 2016-06-21 DIAGNOSIS — Z7984 Long term (current) use of oral hypoglycemic drugs: Secondary | ICD-10-CM | POA: Diagnosis not present

## 2016-06-21 DIAGNOSIS — I679 Cerebrovascular disease, unspecified: Secondary | ICD-10-CM

## 2016-06-21 DIAGNOSIS — R0602 Shortness of breath: Secondary | ICD-10-CM | POA: Diagnosis not present

## 2016-06-21 DIAGNOSIS — F0391 Unspecified dementia with behavioral disturbance: Secondary | ICD-10-CM | POA: Diagnosis not present

## 2016-06-21 DIAGNOSIS — Z87891 Personal history of nicotine dependence: Secondary | ICD-10-CM | POA: Diagnosis not present

## 2016-06-21 DIAGNOSIS — R262 Difficulty in walking, not elsewhere classified: Secondary | ICD-10-CM | POA: Diagnosis not present

## 2016-06-21 DIAGNOSIS — G9340 Encephalopathy, unspecified: Secondary | ICD-10-CM | POA: Diagnosis not present

## 2016-06-21 DIAGNOSIS — E876 Hypokalemia: Secondary | ICD-10-CM | POA: Diagnosis not present

## 2016-06-21 DIAGNOSIS — Z7901 Long term (current) use of anticoagulants: Secondary | ICD-10-CM

## 2016-06-21 DIAGNOSIS — G40201 Localization-related (focal) (partial) symptomatic epilepsy and epileptic syndromes with complex partial seizures, not intractable, with status epilepticus: Secondary | ICD-10-CM | POA: Diagnosis present

## 2016-06-21 DIAGNOSIS — R1312 Dysphagia, oropharyngeal phase: Secondary | ICD-10-CM | POA: Diagnosis not present

## 2016-06-21 DIAGNOSIS — I69859 Hemiplegia and hemiparesis following other cerebrovascular disease affecting unspecified side: Secondary | ICD-10-CM | POA: Diagnosis not present

## 2016-06-21 DIAGNOSIS — I4891 Unspecified atrial fibrillation: Secondary | ICD-10-CM | POA: Diagnosis not present

## 2016-06-21 DIAGNOSIS — M6281 Muscle weakness (generalized): Secondary | ICD-10-CM | POA: Diagnosis not present

## 2016-06-21 DIAGNOSIS — E870 Hyperosmolality and hypernatremia: Secondary | ICD-10-CM | POA: Diagnosis not present

## 2016-06-21 DIAGNOSIS — E860 Dehydration: Secondary | ICD-10-CM

## 2016-06-21 DIAGNOSIS — Z741 Need for assistance with personal care: Secondary | ICD-10-CM | POA: Diagnosis not present

## 2016-06-21 DIAGNOSIS — G40901 Epilepsy, unspecified, not intractable, with status epilepticus: Secondary | ICD-10-CM | POA: Diagnosis not present

## 2016-06-21 DIAGNOSIS — F0390 Unspecified dementia without behavioral disturbance: Secondary | ICD-10-CM

## 2016-06-21 DIAGNOSIS — E1159 Type 2 diabetes mellitus with other circulatory complications: Secondary | ICD-10-CM | POA: Diagnosis not present

## 2016-06-21 DIAGNOSIS — R531 Weakness: Secondary | ICD-10-CM | POA: Diagnosis not present

## 2016-06-21 DIAGNOSIS — F05 Delirium due to known physiological condition: Secondary | ICD-10-CM | POA: Diagnosis not present

## 2016-06-21 DIAGNOSIS — I639 Cerebral infarction, unspecified: Secondary | ICD-10-CM | POA: Diagnosis not present

## 2016-06-21 DIAGNOSIS — I69915 Cognitive social or emotional deficit following unspecified cerebrovascular disease: Secondary | ICD-10-CM

## 2016-06-21 DIAGNOSIS — R488 Other symbolic dysfunctions: Secondary | ICD-10-CM | POA: Diagnosis not present

## 2016-06-21 DIAGNOSIS — R29702 NIHSS score 2: Secondary | ICD-10-CM | POA: Diagnosis present

## 2016-06-21 DIAGNOSIS — I482 Chronic atrial fibrillation: Secondary | ICD-10-CM

## 2016-06-21 DIAGNOSIS — R1310 Dysphagia, unspecified: Secondary | ICD-10-CM | POA: Diagnosis not present

## 2016-06-21 DIAGNOSIS — I69991 Dysphagia following unspecified cerebrovascular disease: Secondary | ICD-10-CM | POA: Diagnosis not present

## 2016-06-21 DIAGNOSIS — R279 Unspecified lack of coordination: Secondary | ICD-10-CM | POA: Diagnosis not present

## 2016-06-21 DIAGNOSIS — H9190 Unspecified hearing loss, unspecified ear: Secondary | ICD-10-CM | POA: Diagnosis present

## 2016-06-21 DIAGNOSIS — I619 Nontraumatic intracerebral hemorrhage, unspecified: Secondary | ICD-10-CM | POA: Diagnosis not present

## 2016-06-21 DIAGNOSIS — I69320 Aphasia following cerebral infarction: Secondary | ICD-10-CM | POA: Diagnosis not present

## 2016-06-21 HISTORY — DX: Essential (primary) hypertension: I10

## 2016-06-21 HISTORY — DX: Cerebral infarction, unspecified: I63.9

## 2016-06-21 HISTORY — DX: Type 2 diabetes mellitus without complications: E11.9

## 2016-06-21 HISTORY — DX: Chronic atrial fibrillation, unspecified: I48.20

## 2016-06-21 LAB — GLUCOSE, CAPILLARY: Glucose-Capillary: 159 mg/dL — ABNORMAL HIGH (ref 65–99)

## 2016-06-21 MED ORDER — INSULIN ASPART 100 UNIT/ML ~~LOC~~ SOLN
0.0000 [IU] | Freq: Three times a day (TID) | SUBCUTANEOUS | Status: DC
  Administered 2016-06-23: 1 [IU] via SUBCUTANEOUS
  Administered 2016-06-23: 2 [IU] via SUBCUTANEOUS
  Administered 2016-06-23 – 2016-06-24 (×3): 1 [IU] via SUBCUTANEOUS
  Administered 2016-06-25: 2 [IU] via SUBCUTANEOUS
  Administered 2016-06-26: 1 [IU] via SUBCUTANEOUS
  Administered 2016-06-27: 2 [IU] via SUBCUTANEOUS
  Administered 2016-06-27 (×2): 3 [IU] via SUBCUTANEOUS
  Administered 2016-06-28: 2 [IU] via SUBCUTANEOUS
  Administered 2016-06-28: 5 [IU] via SUBCUTANEOUS
  Administered 2016-06-28: 2 [IU] via SUBCUTANEOUS
  Administered 2016-06-29: 5 [IU] via SUBCUTANEOUS
  Administered 2016-06-29: 2 [IU] via SUBCUTANEOUS

## 2016-06-21 MED ORDER — ATENOLOL 25 MG PO TABS: 25.0000 mg | ORAL_TABLET | Freq: Three times a day (TID) | ORAL | Status: DC

## 2016-06-21 MED ORDER — HALOPERIDOL LACTATE 5 MG/ML IJ SOLN
5.0000 mg | Freq: Four times a day (QID) | INTRAMUSCULAR | Status: DC | PRN
  Administered 2016-06-21 – 2016-06-23 (×2): 5 mg via INTRAVENOUS
  Filled 2016-06-21 (×2): qty 1

## 2016-06-21 MED ORDER — SODIUM CHLORIDE 0.9 % IV SOLN: 15.0000 mg/kg | Freq: Once | INTRAVENOUS | Status: DC

## 2016-06-21 MED ORDER — SODIUM CHLORIDE 0.9 % IV SOLN
1500.0000 mg | INTRAVENOUS | Status: AC
  Administered 2016-06-21: 1500 mg via INTRAVENOUS
  Filled 2016-06-21: qty 15

## 2016-06-21 MED ORDER — LORAZEPAM 2 MG/ML IJ SOLN
1.0000 mg | Freq: Once | INTRAMUSCULAR | Status: DC
  Filled 2016-06-21: qty 1

## 2016-06-21 MED ORDER — SODIUM CHLORIDE 0.9 % IV SOLN
INTRAVENOUS | Status: DC
  Administered 2016-06-21 – 2016-06-28 (×8): via INTRAVENOUS

## 2016-06-21 MED ORDER — SODIUM CHLORIDE 0.9 % IV SOLN
15.0000 mg/kg | INTRAVENOUS | Status: AC
  Administered 2016-06-21: 1053 mg via INTRAVENOUS
  Filled 2016-06-21: qty 21.06

## 2016-06-21 MED ORDER — ATORVASTATIN CALCIUM 10 MG PO TABS
20.0000 mg | ORAL_TABLET | Freq: Every day | ORAL | Status: DC
  Administered 2016-06-26 – 2016-06-28 (×3): 20 mg via ORAL
  Filled 2016-06-21 (×3): qty 2

## 2016-06-21 MED ORDER — STROKE: EARLY STAGES OF RECOVERY BOOK: Freq: Once | Status: DC

## 2016-06-21 MED ORDER — APIXABAN 5 MG PO TABS: 5.0000 mg | ORAL_TABLET | Freq: Two times a day (BID) | ORAL | Status: DC

## 2016-06-21 NOTE — Consult Note (Signed)
Neurology Consult Note  Reason for Consultation: Altered mental status  Requesting provider: Madelyn Flavors, MD  CC: None--patient is seizing at the time of my visit  HPI: This is an 56-yo RH man who is transferred from OSH for further evaluation of possible seizure versus stroke.   He was seen at [facility] ED today after change in mental status at home. His son reports that he was having trouble seeing what was on his plate while he was trying to eat lunch and then started staring off into space. His speech became slurred, and stopped speaking, and he developed a gaze preference. He was taken to the ED where CT was interpreted as showing an acute nonhemorrhagic infarct in the high right parietal lobe with stable left PCA territory encephalomalacia. On ED MD exam, he was noted to be confused with minor facial weakness with NIHSS of 2. Teleneurology consultation was obtained and they recommended MRI and EEG as they felt this could be either seizure or acute stroke. He was transferred to [facility] for further evaluation because [facility] has no available neurology coverage.    From [facility] today: CBC unremarkable  CMP BUN 32, glucose 117 Troponin-I <0.01 CTH without contrast as noted above  I was called by the patient's RN that he was having stiffening of the left leg with left head turn. On my arrival to the bedside, he was having persistent tonic-clonic activity of the left arm and leg with his head turned up and to the left. No eye deviation. He actively resisted eye opening, saying "Damn it" when I checked his pupils. He had purposeful movement of the right side. He was loaded with fosphenytoin 15 PE/kg with resolution of the continuous seizure but he continued to have occasional brief focal seizures on the left. He was then loaded with Keppra 1500 mg IV.   His family at the bedside report that he had as stroke in August 2017 that resulted in left-sided weakness. He recovered well,  however, and received home PT/OT with minimal residual deficits. On review of the chart, he was admitted to [facility] from 8/12-8/15/17. He presented at the time with the acute onset of left hemiparesis and R gaze. He was given IV tPA with improvement in symptoms after infusion. Workup revealed scattered infarcts in the R MCA territory, the largest of which was in the R parietal lobe. He had some asymptomatic hemorrhagic transformation of his strokes after tPA. It was felt that his strokes were the result of atrial fibrillation for which he was not anticoagulated. At the time of discharge, it was recommended that he have a repeat CT of the head in three weeks and if no hemorrhage was seen that he be started on Eliquis for secondary stroke prevention. Discharge examination documented normal strength in all extremities with dysmetria and postural tremor in the R hand. He has an upcoming follow-up appointment with Dr. Roda Shutters, vascular neurology, on 07/07/16.     PMH:  1. Atrial fibrillation 2. DM 3. HTN 4. 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 Dementia 6. Hearing loss  PSH:  No past surgical history on file.  Family history: None reported  Social history:  He is widowed. He lives with his son. They report that he is independent with the majority his basic activities of daily living. However, they state that his short-term memory is quite poor and he requires supervision. No reported tobacco, alcohol, or illicit drug use.   Current outpatient meds: Current Meds  Medication Sig  . atenolol (TENORMIN) 25 MG tablet Take 25 mg by mouth 3 (three) times daily.   Marland Kitchen atorvastatin (LIPITOR) 20 MG tablet Take 1 tablet (20 mg total) by mouth daily at 6 PM.  . Cyanocobalamin (B-12 PO) Take 1 tablet by mouth daily.  Marland Kitchen ELIQUIS 5 MG TABS tablet Take 5 mg by mouth daily.  . furosemide (LASIX) 40 MG tablet Take 40 mg by mouth daily.  Marland Kitchen MELATONIN PO Take 1 tablet by mouth daily.  . metFORMIN (GLUCOPHAGE) 500 MG  tablet Take 500 mg by mouth 2 (two) times daily with a meal.  . nitrofurantoin (MACRODANTIN) 100 MG capsule Take 100 mg by mouth daily.    Current inpatient meds:  Current Facility-Administered Medications  Medication Dose Route Frequency Provider Last Rate Last Dose  .  stroke: mapping our early stages of recovery book   Does not apply Once Clydie Braun, MD      . 0.9 %  sodium chloride infusion   Intravenous Continuous Clydie Braun, MD      . apixaban (ELIQUIS) tablet 5 mg  5 mg Oral BID Clydie Braun, MD      . atenolol (TENORMIN) tablet 25 mg  25 mg Oral TID Clydie Braun, MD      . atorvastatin (LIPITOR) tablet 20 mg  20 mg Oral q1800 Rondell A Katrinka Blazing, MD      . fosPHENYtoin (CEREBYX) 1,053 mg PE in sodium chloride 0.9 % 50 mL IVPB  15 mg PE/kg Intravenous STAT Rondell A Katrinka Blazing, MD      . haloperidol lactate (HALDOL) injection 5 mg  5 mg Intravenous Q6H PRN Clydie Braun, MD   5 mg at 06/21/16 2040  . [START ON 06/22/2016] insulin aspart (novoLOG) injection 0-9 Units  0-9 Units Subcutaneous TID WC Clydie Braun, MD        Allergies: Allergies  Allergen Reactions  . Ciprofloxacin Other (See Comments)    Made pt crazy    ROS: As per HPI. A full 14-point review of systems Cannot be obtained at this time as the patient is seizing.   PE:  BP (!) 161/84 (BP Location: Right Arm)   Pulse 97   Temp 97.6 F (36.4 C) (Axillary)   Resp (!) 22   Wt 70.2 kg (154 lb 12.2 oz)   SpO2 100%   General: Elderly Caucasian man lying in bed. His eyes are closed. He is having active seizure activity involving the left side of his body. At times, he does mumble and at one point said "damn it" while I was examining him. Spontaneous speech is limited, however. HEENT: Normocephalic. Neck supple without LAD. Sclerae anicteric. No conjunctival injection.  CV: Irregularly irregular, no obvious murmur. Carotid pulses full and symmetric, no bruits. Distal pulses 2+ and symmetric.  Lungs: CTAB on  anterior auscultation.  Abdomen: Soft, non-distended, no rebound or guarding. Bowel sounds present x4.  Extremities: No C/C/E. Neuro:  CN: His left pupil is irregular and appears post surgical. He resists eye opening making assessment of the pupils and eyes difficult. He does not have forced eye deviation, however. Corneals are intact bilaterally. He has symmetric strong eye closure. Some twitching of the left side of the mouth and chin is noted, consistent with partial seizure activity. The remainder of his cranial nerve examination is limited by his inability to participate with the exam.  Motor: Normal bulk. He moves the right side spontaneously with grossly normal strength. He  is having ongoing tonic-clonic seizure activity at the left arm and leg.  Sensation: He withdraws the right side purposely from noxious stimulation.  DTRs: Limited by seizure and by spontaneous movement of the right side. Toes equivocal bilaterally. Coordination: No gross dysmetria or ataxia of the right side is appreciated. He is unable to participate with formal testing at this time.   Labs:  Lab Results  Component Value Date   WBC 7.9 04/21/2016   HGB 13.8 04/21/2016   HCT 42.9 04/21/2016   PLT 185 04/21/2016   GLUCOSE 144 (H) 04/21/2016   CHOL 151 04/19/2016   TRIG 51 04/19/2016   HDL 45 04/19/2016   LDLCALC 96 04/19/2016   NA 138 04/21/2016   K 4.5 04/21/2016   CL 104 04/21/2016   CREATININE 0.89 04/21/2016   BUN 19 04/21/2016   CO2 28 04/21/2016   TSH 0.996 04/20/2016   HGBA1C 6.4 (H) 04/19/2016    Imaging:  CTH from OSH results as noted in HPI above.   Other diagnostic studies:  TTE 04/20/16: LV normal size; mild LVH; EF 55%; normal wall motion; mild aortic regurgitation; mild calcification of the mitral annulus; severe dilation of both atria; moderate tricuspid regurgitation  Carotid Dopplers 04/19/16: No hemodynamically significant stenosis.   Assessment and Plan:  1. Complex partial status  epilepticus: Presentation is consistent with seizure and he is having active and continuous seizure at the time of my examination. His seizures involved the left side of his body with a left head turn and are consistent with his recent right MCA territory stroke being the focus. Recommend checking urinalysis to exclude possible UTI as a trigger for his seizures today. No other obvious metabolic abnormalities present that would explain these. He was loaded with fosphenytoin with significant reduction in his seizure activity, though due to occasional brief episodes of ongoing partial seizure he was also loaded with Keppra 1500 mg IV. I will continue phenytoin 300 mg daily at bedtime beginning 06/22/16. Seizure precautions.  2. Cerebrovascular disease: He had a right MCA territory infarct in August 2017 that was felt to be cardioembolic in nature due to underlying atrial fibrillation. He was recently started on Eliquis for secondary prevention and this can be continued. Continue aggressive management of underlying risk factors, including diabetes, hypertension, and lipids. Though I do not have the CT scan from the outside hospital available for direct review at this time, the report describes an infarct in the superior aspect of the right parietal lobe which is consistent with what is noted on recent CT scan of the head in September 2017 as well as an MRI from August 2017. It is not clear to me that he has had an acute ischemic stroke with today's episode, though this cannot be completely excluded. Could consider MRI scan of the brain for further evaluation, though this is not likely to change overall management.  3. Dementia: His family describes what sounds like a fairly significant underlying dementia. This unfortunately places him at increased risk for delirium from all causes and could predict delayed and possibly incomplete recovery from the same. He was showing some evidence of delirium prior to tonight's  seizures according to his nurse. Avoid CNS active medications, particularly benzodiazepines, opiates, and anything with strong anticholinergic properties. I will reserve benzodiazepines for refractory seizure. If agitation is an issue, recommend low doses of haloperidol or an atypical neuroleptic as needed.  This was all discussed with the patient's family at the bedside at the time of  my visit. They are in agreement with the plan as noted. They were given the opportunity to ask any questions and these were addressed to their satisfaction.  Thank you for the opportunity to participate in this patient's care. Please don't  hesitate to call with any questions or concerns. Neurology will continue to follow.  This patient is critically ill and at significant risk of neurological worsening, death and care requires constant monitoring of vital signs, hemodynamics,respiratory and cardiac monitoring, neurological assessment, discussion with family, other specialists and medical decision making of high complexity. A total of 80 minutes of critical care time was spent on this case.

## 2016-06-21 NOTE — Progress Notes (Signed)
Patient rigid, LLE spastic, rigid, head tilting to left, ECG HR 130, patient in afib. Vital signs collected, see flowsheets. On-call neuro paged. Patient placed on 2 liters O2 nasal canula. RN and NT at bedside. RN will continue to monitor.

## 2016-06-21 NOTE — Progress Notes (Signed)
Patient arrived to floor 1850 via [facility] on stretcher. Patient is alert with confusion. Vital signs stable. Telemetry notified. Swallow screen failed at this time. Sitter order in place.

## 2016-06-21 NOTE — H&P (Addendum)
History and Physical    Barry Jones DOB: 05/28/1931 DOA: 06/21/2016  Referring Jones/NP/PA: Dr. Marland McalpineSheikh PCP: Barry FusiSCHULTZ,DOUGLAS E, Jones  [facility]  Chief Complaint: Confusion and gaze preference  HPI: Barry Jones with medical history significant of CVA, HTN, DMII, afib on Eliquis, and dementia; who presents with confusion and gaze preference. History is obtained mostly through the records as patient is unable to give full history at this time due to confusion. Patient was noted to have started staring off on eating lunch acutely at approximately 1435 today. Baseline functional status not clearly known at this time. He does not complain of any pain at this time. Patient was just admitted in August 2017 after being found to have a right parietal stroke for which patient was given TPA. Symptoms during that time included left-sided weakness and right-sided gaze preference which is questioned to possibly be similar to his presentation to today. In August, the patient was noted to have developed an area of bleeding following administration of TPA. Bilateral carotid dopplers did not show any significant stenosis, and vertebral artery flow was antegrade. Echocardiogram showed a EF 55% and the rhythm was atrial fibrillation.   ED Course: [facility] patient was noted to be afebrile, blood pressure 176/92, and all other vitals relatively within normal limits. Lab work revealed WBC 7.1, hemoglobin 14, platelet count 157, INR 1.4, PT 14.7, APTT 28.6, Bun 32, Cr 1, and all other values  within normal limits. CT of the brain showed remote left occipital stroke with more recent right parietal stroke, but no significant blood appreciated at this time . There was a question of the patient had had possible seizure versus stroke. EKG at the outside facility showed atrial fibrillation. Transfer requested to our facility for possible need of  MRI/EEG and neurology consult.   Review of Systems: Unable to fully obtain secondary to patient confusion.  Past Medical History:  Diagnosis Date  . Chronic atrial fibrillation (HCC)   . Diabetes mellitus type II, controlled (HCC)   . HTN (hypertension)     No past surgical history on file.   has an unknown smoking status. He has never used smokeless tobacco. His alcohol and drug histories are not on file.  Allergies  Allergen Reactions  . Ciprofloxacin Other (See Comments)    Made pt crazy    No family history on file.  Prior to Admission medications   Medication Sig Start Date End Date Taking? Authorizing Provider  atenolol (TENORMIN) 25 MG tablet Take 25 mg by mouth 3 (three) times daily.  08/27/15  Yes Historical Provider, Jones  atorvastatin (LIPITOR) 20 MG tablet Take 1 tablet (20 mg total) by mouth daily at 6 PM. 04/21/16  Yes Layne BentonSharon L Biby, NP  Cyanocobalamin (B-12 PO) Take 1 tablet by mouth daily.   Yes Historical Provider, Jones  ELIQUIS 5 MG TABS tablet Take 5 mg by mouth daily. 06/10/16  Yes Historical Provider, Jones  furosemide (LASIX) 40 MG tablet Take 40 mg by mouth daily. 06/15/16  Yes Historical Provider, Jones  MELATONIN PO Take 1 tablet by mouth daily.   Yes Historical Provider, Jones  metFORMIN (GLUCOPHAGE) 500 MG tablet Take 500 mg by mouth 2 (two) times daily with a meal.   Yes Historical Provider, Jones  nitrofurantoin (MACRODANTIN) 100 MG capsule Take 100 mg by mouth daily. 06/19/16  Yes Historical Provider, Jones    Physical Exam:   Constitutional: Elderly Jones  who appears not able to follow commands, but is alert Vitals:   06/21/16 1800  BP: (!) 174/118  Pulse: 100  Resp: (!) 22  Temp: 97.6 F (36.4 C)  TempSrc: Axillary  SpO2: 100%   Eyes: PERRL, lids and conjunctivae normal ENMT: Decreased hearing. Mucous membranes are dry. Posterior pharynx clear of any exudate or lesions.Normal dentition.  Neck: normal, supple, no masses, no thyromegaly Respiratory: clear to  auscultation bilaterally, no wheezing, no crackles. Mildly tachypneic. No accessory muscle use.  Cardiovascular: Irregular irregular. no murmurs / rubs / gallops. No extremity edema. 2+ pedal pulses. No carotid bruits.  Abdomen: no tenderness, no masses palpated. No hepatosplenomegaly. Bowel sounds positive.  Musculoskeletal: no clubbing / cyanosis. No joint deformity upper and lower extremities. Good ROM, no contractures. Normal muscle tone.  Skin: Abrasion to the left elbow. Otherwise small areas of bruising noted of the upper and lower extremities. Neurologic: CN 2-12 grossly intact. Patient appears able to move all extremities with no significant deficit appreciated insurance. Psychiatric:  Alert but confused and unable to follow commands.. Normal mood.     Labs on Admission: I have personally reviewed following labs and imaging studies  CBC: No results for input(s): WBC, NEUTROABS, HGB, HCT, MCV, PLT in the last 168 hours. Basic Metabolic Panel: No results for input(s): NA, K, CL, CO2, GLUCOSE, BUN, CREATININE, CALCIUM, MG, PHOS in the last 168 hours. GFR: CrCl cannot be calculated (Unknown ideal weight.). Liver Function Tests: No results for input(s): AST, ALT, ALKPHOS, BILITOT, PROT, ALBUMIN in the last 168 hours. No results for input(s): LIPASE, AMYLASE in the last 168 hours. No results for input(s): AMMONIA in the last 168 hours. Coagulation Profile: No results for input(s): INR, PROTIME in the last 168 hours. Cardiac Enzymes: No results for input(s): CKTOTAL, CKMB, CKMBINDEX, TROPONINI in the last 168 hours. BNP (last 3 results) No results for input(s): PROBNP in the last 8760 hours. HbA1C: No results for input(s): HGBA1C in the last 72 hours. CBG: No results for input(s): GLUCAP in the last 168 hours. Lipid Profile: No results for input(s): CHOL, HDL, LDLCALC, TRIG, CHOLHDL, LDLDIRECT in the last 72 hours. Thyroid Function Tests: No results for input(s): TSH, T4TOTAL,  FREET4, T3FREE, THYROIDAB in the last 72 hours. Anemia Panel: No results for input(s): VITAMINB12, FOLATE, FERRITIN, TIBC, IRON, RETICCTPCT in the last 72 hours. Urine analysis: No results found for: COLORURINE, APPEARANCEUR, LABSPEC, PHURINE, GLUCOSEU, HGBUR, BILIRUBINUR, KETONESUR, PROTEINUR, UROBILINOGEN, NITRITE, LEUKOCYTESUR Sepsis Labs: No results found for this or any previous visit (from the past 240 hour(s)).   Radiological Exams on Admission: No results found.  EKG: Independently reviewed. Chronic atrial fibrillation  Assessment/Plan Acute encephalopathy/suspected partial seizures: Acute. Patient presents with confusion, but hard to assess if this is partly secondary to decreased ability to hear.  May need to see if patient wears hearing aids. Neurology evaluated.patient's symptoms likely consistent with complex partial seizures which recommended loading the patient with Keppra 1500 mg and continued phenytoin 300 mg daily at bedtime starting 10/16. - Admit to a telemetry bed - check urinalysis and CXR - Seizure precautions - Haldol IV  low-dose prn for agitation - Will need to continue Phenytoin 300 mg IV at bedtime - Appreciated neurology consultation, will follow further recommendations  Recent CVA: Patient was just admitted into the hospital on 04/19/2016 right parietal stroke status post TPA at that time. Acute symptoms during tonight's presentation similar which would not suspect a new stroke. - Stroke orderset initiated - bedside swallow eval  -  PT/OT/speech eval and treat - Would not reorder echocardiogram/carotid Dopplers at this time - MRI/ EEG per neurology  - Appreciate neurology consultation will follow recommendations   Dementia: Patient placed on unknown at this time - Continue to monitor  Atrial fibrillation: Chronic. - Continue Eliquis when able  Dehydration: Elevated BUN to creatinine ratio 30:1 as seen on lab work at the outside facility. - IV fluids of  NS at 75 ml/hr - hold diuretics at this time - Recheck BMP in a.m.  Diabetes mellitus type [id number]. Last hemoglobin A1c on 04/19/2016 6.4. - Hold metformin  - CBGs every before meals with sensitive sliding scale insulin  Hyperlipidemia: Last lipid panel performed on 04/19/2016 with cholesterol 151, triglycerides 51, HDL 45, LDL calculated 96. - Continue atorvastatin when able  DVT prophylaxis: Continue Eliquis Code Status: Full code Family Communication: No family present at bedside  Disposition Plan: TBD Consults called:  neurology  Admission status: Inpatient   Clydie Braun Jones [facility] Pager [phone]  If 7PM-7AM, please contact night-coverage [email or web address] Password [email or web address]  06/21/2016, 7:26 PM

## 2016-06-22 ENCOUNTER — Inpatient Hospital Stay (HOSPITAL_COMMUNITY)
Admission: AD | Admit: 2016-06-22 | Discharge: 2016-06-22 | Disposition: A | Discharge status: Home / Self Care | Payer: Medicare Other | Source: Other Acute Inpatient Hospital | Attending: Neurology | Admitting: Neurology

## 2016-06-22 DIAGNOSIS — I4891 Unspecified atrial fibrillation: Secondary | ICD-10-CM

## 2016-06-22 DIAGNOSIS — F0391 Unspecified dementia with behavioral disturbance: Secondary | ICD-10-CM

## 2016-06-22 DIAGNOSIS — G9340 Encephalopathy, unspecified: Secondary | ICD-10-CM

## 2016-06-22 DIAGNOSIS — F05 Delirium due to known physiological condition: Secondary | ICD-10-CM

## 2016-06-22 LAB — URINALYSIS, ROUTINE W REFLEX MICROSCOPIC
BILIRUBIN URINE: NEGATIVE
Glucose, UA: NEGATIVE mg/dL
HGB URINE DIPSTICK: NEGATIVE
Ketones, ur: NEGATIVE mg/dL
Nitrite: NEGATIVE
PH: 6 (ref 5.0–8.0)
Protein, ur: NEGATIVE mg/dL
SPECIFIC GRAVITY, URINE: 1.012 (ref 1.005–1.030)

## 2016-06-22 LAB — BASIC METABOLIC PANEL
Anion gap: 11 (ref 5–15)
BUN: 24 mg/dL — AB (ref 6–20)
CHLORIDE: 101 mmol/L (ref 101–111)
CO2: 30 mmol/L (ref 22–32)
CREATININE: 0.98 mg/dL (ref 0.61–1.24)
Calcium: 8.6 mg/dL — ABNORMAL LOW (ref 8.9–10.3)
GFR calc Af Amer: >60 mL/min (ref >60)
GFR calc non Af Amer: >60 mL/min (ref >60)
Glucose, Bld: 121 mg/dL — ABNORMAL HIGH (ref 65–99)
Potassium: 3.5 mmol/L (ref 3.5–5.1)
SODIUM: 142 mmol/L (ref 135–145)

## 2016-06-22 LAB — URINE MICROSCOPIC-ADD ON

## 2016-06-22 LAB — CBC WITH DIFFERENTIAL/PLATELET
Basophils Absolute: 0 10*3/uL (ref 0.0–0.1)
Basophils Relative: 1 %
EOS ABS: 0.1 10*3/uL (ref 0.0–0.7)
EOS PCT: 1 %
HCT: 42.1 % (ref 39.0–52.0)
HEMOGLOBIN: 13.8 g/dL (ref 13.0–17.0)
LYMPHS ABS: 1.6 10*3/uL (ref 0.7–4.0)
Lymphocytes Relative: 22 %
MCH: 30.6 pg (ref 26.0–34.0)
MCHC: 32.8 g/dL (ref 30.0–36.0)
MCV: 93.3 fL (ref 78.0–100.0)
MONO ABS: 0.7 10*3/uL (ref 0.1–1.0)
MONOS PCT: 9 %
NEUTROS PCT: 67 %
Neutro Abs: 4.8 10*3/uL (ref 1.7–7.7)
Platelets: 155 10*3/uL (ref 150–400)
RBC: 4.51 MIL/uL (ref 4.22–5.81)
RDW: 15 % (ref 11.5–15.5)
WBC: 7.1 10*3/uL (ref 4.0–10.5)

## 2016-06-22 LAB — PHENYTOIN LEVEL, TOTAL: PHENYTOIN LVL: 16.2 ug/mL (ref 10.0–20.0)

## 2016-06-22 LAB — AMMONIA: Ammonia: 11 umol/L (ref 9–35)

## 2016-06-22 LAB — TSH: TSH: 1.384 u[IU]/mL (ref 0.350–4.500)

## 2016-06-22 LAB — ALBUMIN: ALBUMIN: 3.5 g/dL (ref 3.5–5.0)

## 2016-06-22 LAB — GLUCOSE, CAPILLARY
GLUCOSE-CAPILLARY: 104 mg/dL — AB (ref 65–99)
GLUCOSE-CAPILLARY: 107 mg/dL — AB (ref 65–99)
Glucose-Capillary: 116 mg/dL — ABNORMAL HIGH (ref 65–99)

## 2016-06-22 MED ORDER — SODIUM CHLORIDE 0.9 % IV SOLN
750.0000 mg | INTRAVENOUS | Status: AC
  Administered 2016-06-22: 750 mg via INTRAVENOUS
  Filled 2016-06-22: qty 7.5

## 2016-06-22 MED ORDER — DIPHENHYDRAMINE HCL 50 MG/ML IJ SOLN
25.0000 mg | Freq: Once | INTRAMUSCULAR | Status: AC
  Administered 2016-06-22: 25 mg via INTRAVENOUS
  Filled 2016-06-22: qty 1

## 2016-06-22 MED ORDER — LORAZEPAM 2 MG/ML IJ SOLN: 1.0000 mg | Freq: Two times a day (BID) | INTRAMUSCULAR | Status: DC | PRN

## 2016-06-22 MED ORDER — METOPROLOL TARTRATE 5 MG/5ML IV SOLN
2.5000 mg | Freq: Four times a day (QID) | INTRAVENOUS | Status: DC
  Administered 2016-06-23 – 2016-06-28 (×23): 2.5 mg via INTRAVENOUS
  Filled 2016-06-22 (×22): qty 5

## 2016-06-22 MED ORDER — SODIUM CHLORIDE 0.9 % IV SOLN
750.0000 mg | Freq: Two times a day (BID) | INTRAVENOUS | Status: DC
  Administered 2016-06-22 – 2016-06-23 (×2): 750 mg via INTRAVENOUS
  Filled 2016-06-22 (×6): qty 7.5

## 2016-06-22 MED ORDER — PHENYTOIN SODIUM 50 MG/ML IJ SOLN
100.0000 mg | Freq: Three times a day (TID) | INTRAMUSCULAR | Status: DC
  Administered 2016-06-22: 100 mg via INTRAVENOUS
  Filled 2016-06-22 (×2): qty 2

## 2016-06-22 NOTE — Progress Notes (Signed)
PROGRESS NOTE  Barry Jones JKK:[id number] DOB: 1931/02/02 DOA: 06/21/2016 PCP: Paulina Fusi, MD  Brief History:   82 y.o. male with medical history significant of CVA, HTN, DMII, afib on Eliquis, and dementia; who presents with confusion and gaze preference.   His son reports that he was having trouble seeing what was on his plate while he was trying to eat lunch and then started staring off into space. His speech became slurred, and stopped speaking, and he developed a gaze preference. He was taken to the ED where CT was interpreted as showing an acute nonhemorrhagic infarct in the high right parietal lobe with stable left PCA territory encephalomalacia. The pt was transferred to [facility] where neurology was consulted.  The patient was given fosphenytoin  with resolution of the continuous seizure but he continued to have occasional brief focal seizures on the left. He was then loaded with Keppra 1500 mg IV.   He had as stroke in August 2017 that resulted in left-sided weakness. He recovered well, however, and received home PT/OT with minimal residual deficits. On review of the chart, he was admitted to [facility] from 8/12-8/15/17. He presented at the time with the acute onset of left hemiparesis and R gaze. He was given IV tPA with improvement in symptoms after infusion. Workup revealed scattered infarcts in the R MCA territory, the largest of which was in the R parietal lobe. He had some asymptomatic hemorrhagic transformation of his strokes after tPA. It was felt that his strokes were the result of atrial fibrillation for which he was not anticoagulated. At the time of discharge, it was recommended that he have a repeat CT of the head in three weeks and if no hemorrhage was seen that he be started on Eliquis for secondary stroke prevention.  Assessment/Plan: Acute Encephalopathy -multifactorial including seizures and anticholinergic medications (given benadryl) -04/20/2016 serum B12  672 -Urinalysis negative for pyuria -04/20/2016 TSH 0.996 -Check ammonia  Complex partial status epilepticus -manifested by  tonic-clonic activity of the left arm and leg with his head turned up and to the left witness by neurology--consistent with his recent right MCA territory stroke being the focus.  -AEDs per neurology -EEG pending -MRI per neurology   Cerebrovascular disease:  -had a right MCA territory infarct in August 2017 that was felt to be cardioembolic in nature due to underlying atrial fibrillation -recently started on Eliquis for secondary prevention and this can be continued per neurology  -repeat MRI brain per neurology  Dementia:  -His family describes what sounds like a fairly significant underlying dementia.  -places him at increased risk for delirium from all causes and could predict delayed and possibly incomplete recovery from the same. -Avoid CNS active medications, particularly benzodiazepines, opiates, and anything with strong anticholinergic properties -haldol prn  Chronic Atrial Fibrillation -rate controlled -continue apixaban -continue atenolol -CHADSVASc = 7  Diabetes Mellitus type 2 -04/19/2016 and hemoglobin A1c 6.4 -NovoLog sliding scale -Allow for liberal glycemic control  Hyperlipidemia -Continue statin  Dehydration -continue IVF -am BMP  HTN -continue atenolol   Disposition Plan:   Home in 1-2 days  Family Communication:   Son updated at bedside--10/16--Total time spent 35 minutes.  Greater than 50% spent face to face counseling and coordinating care.   Consultants:  Neurology  Code Status:  FULL--confirmed with son at bedside  DVT Prophylaxis:  apixaban   Procedures: As Listed in Progress Note Above  Antibiotics: None  Subjective: Patient is encephalopathic and unable to provide review of systems. He awakens but does not follow commands or answer questions appropriately.  Objective: Vitals:   06/22/16 0012  06/22/16 0123 06/22/16 0345 06/22/16 0700  BP:  (!) 131/55 (!) 153/71 (!) 157/87  Pulse:  74 77 72  Resp:  18 18 18   Temp:  97.7 F (36.5 C)    TempSrc:  Axillary    SpO2:  96% 96% 100%  Weight: 70.4 kg (155 lb 3.3 oz)       Intake/Output Summary (Last 24 hours) at 06/22/16 0937 Last data filed at 06/22/16 0700  Gross per 24 hour  Intake              525 ml  Output              500 ml  Net               25 ml   Weight change:  Exam:   General:  Pt is alert, follows commands appropriately, not in acute distress  HEENT: No icterus, No thrush, No neck mass, [place]/AT  Cardiovascular: IRRR, S1/S2, no rubs, no gallops  Respiratory: Diminished breath sounds, but CTA bilaterally, no wheezing, no crackles, no rhonchi  Abdomen: Soft/+BS, non tender, non distended, no guarding  Extremities: No edema, No lymphangitis, No petechiae, No rashes, no synovitis   Data Reviewed: I have personally reviewed following labs and imaging studies Basic Metabolic Panel:  Recent Labs Lab 06/22/16 0345  NA 142  K 3.5  CL 101  CO2 30  GLUCOSE 121*  BUN 24*  CREATININE 0.98  CALCIUM 8.6*   Liver Function Tests:  Recent Labs Lab 06/22/16 0345  ALBUMIN 3.5   No results for input(s): LIPASE, AMYLASE in the last 168 hours. No results for input(s): AMMONIA in the last 168 hours. Coagulation Profile: No results for input(s): INR, PROTIME in the last 168 hours. CBC:  Recent Labs Lab 06/22/16 0345  WBC 7.1  NEUTROABS 4.8  HGB 13.8  HCT 42.1  MCV 93.3  PLT 155   Cardiac Enzymes: No results for input(s): CKTOTAL, CKMB, CKMBINDEX, TROPONINI in the last 168 hours. BNP: Invalid input(s): POCBNP CBG:  Recent Labs Lab 06/21/16 2313  GLUCAP 159*   HbA1C: No results for input(s): HGBA1C in the last 72 hours. Urine analysis:    Component Value Date/Time   COLORURINE AMBER (A) 06/22/2016 0022   APPEARANCEUR CLEAR 06/22/2016 0022   LABSPEC 1.012 06/22/2016 0022   PHURINE 6.0  06/22/2016 0022   GLUCOSEU NEGATIVE 06/22/2016 0022   HGBUR NEGATIVE 06/22/2016 0022   BILIRUBINUR NEGATIVE 06/22/2016 0022   KETONESUR NEGATIVE 06/22/2016 0022   PROTEINUR NEGATIVE 06/22/2016 0022   NITRITE NEGATIVE 06/22/2016 0022   LEUKOCYTESUR TRACE (A) 06/22/2016 0022   Sepsis Labs: @LABRCNTIP (procalcitonin:4,lacticidven:4) )No results found for this or any previous visit (from the past 240 hour(s)).   Scheduled Meds: .  stroke: mapping our early stages of recovery book   Does not apply Once  . apixaban  5 mg Oral BID  . atenolol  25 mg Oral TID  . atorvastatin  20 mg Oral q1800  . insulin aspart  0-9 Units Subcutaneous TID WC  . LORazepam  1 mg Intravenous Once  . phenytoin (DILANTIN) IV  100 mg Intravenous Q8H   Continuous Infusions: . sodium chloride 75 mL/hr at 06/21/16 2100    Procedures/Studies: Dg Chest Port 1 View  Result Date: 06/22/2016 CLINICAL DATA:  Shortness of breath.  EXAM: PORTABLE CHEST 1 VIEW COMPARISON:  No prior. FINDINGS: Mediastinum and hilar structures are unremarkable. Cardiomegaly. Mild pulmonary vascular prominence. Low lung volumes. Left lower lobe infiltrate noted. No prominent pleural effusion or pneumothorax. IMPRESSION: 1. Left lower lobe infiltrate.  Low lung volumes. 2. Cardiomegaly with mild pulmonary vascular prominence. Electronically Signed   By: Maisie Fus  Register   On: 06/22/2016 06:48    Nyheem Binette, DO  [facility] Pager [phone]  If 7PM-7AM, please contact night-coverage [email or web address] Password [email or web address] 06/22/2016, 9:37 AM   LOS: 1 day

## 2016-06-22 NOTE — Significant Event (Signed)
Rapid Response Event Note  Overview: Time Called: 2057 Arrival Time: 2110 Event Type: Neurologic  Initial Focused Assessment: Called by RN, patient actively seizing.  Patient s/p stroke in August, is being worked up for stroke versus stroke.  Upon arrival patient was experiencing partial complex seizures on the left side with his head stiffened to the left.  Patient's pupils were reactive, left pupil bigger (recent surgery > ?), patient was moving all extremities and was agitated prior to my arrival (history of dementia).    Neuro changes started today when the patient was at home. Per patient's family, patient had a change in mental status at home. His son reports that he was having trouble seeing what was on his plate while he was trying to eat lunch and then started staring off into space. His speech became slurred, and stopped speaking, and he developed a gaze preference.   + pulses, + good air movement, patient on [facility], MD at bedside.  BP initially hypertensive and then normalized. HR in chronic AFIB  Interventions: -- MD ordered Fosphenytoin bolus and load (q15min BP checked by neurologist) -- patient loaded on keepra as well  Plan of Care (if not transferred): --follow up: noacute changes  Event Summary: Name of Physician Notified: Dr. Roxy Mannsster at 2115    at    Outcome: Stayed in room and stabalized  Event End Time: 2200  Darrill Vreeland R

## 2016-06-22 NOTE — Progress Notes (Signed)
RN consulted rapid response nurse due to patient seizure activity of left leg tense,rigid, spastic with head tilt to left beginning at 2252.  Patient O2 sat 97-100% on 2 L oxygen, patient will respond to daughter with difficulty/stuttering. Bed rails lined and all siezure precautions in place. Patients airway open and clear.Safety sitter present. Frequent vital signs obtained. Patient administered fosphenytoin 15 PE/kg and Keppra 1500 mg IV. Patients symptoms decreased following fosphenytion administration but did not cease until keppra partially complete. IV Ativan ordered if needed. RN will continue to monitor patient.

## 2016-06-22 NOTE — Procedures (Signed)
ELECTROENCEPHALOGRAM REPORT  Date of Study: 06/22/2016  Patient's Name: Barry Jones MRN: [id number] Date of Birth: 1930/10/30  Referring Provider: Versie Starksimothy James Oster, MD  Clinical History: 89-yo RH man with altered mental status, evaluated for possible seizure versus stroke.    Medications: 0.9 % sodium chloride infusion   apixaban (ELIQUIS) tablet 5 mg   atenolol (TENORMIN) tablet 25 mg   atorvastatin (LIPITOR) tablet 20 mg   haloperidol lactate (HALDOL) injection 5 mg   insulin aspart (novoLOG) injection 0-9 Units   LORazepam (ATIVAN) injection 1 mg   LORazepam (ATIVAN) injection 1-2 mg   phenytoin (DILANTIN) injection 100 mg    Technical Summary: A multichannel digital EEG recording measured by the international 10-20 system with electrodes applied with paste and impedances below 5000 ohms performed as portable with EKG monitoring in an awake and patient.  Hyperventilation and photic stimulation were not performed.  The digital EEG was referentially recorded, reformatted, and digitally filtered in a variety of bipolar and referential montages for optimal display.   Description: The patient is awake and drowsy during the recording.  During maximal wakefulness, there is a symmetric, medium voltage 6 Hz posterior dominant rhythm that attenuates with eye opening. This is admixed with diffuse 4-5 Hz theta and 2-3 Hz delta slowing of the waking background.  During drowsiness and sleep, there is an increase in theta slowing of the background.  Vertex waves and symmetric sleep spindles were not seen.  There were no epileptiform discharges or electrographic seizures seen.    EKG lead was unremarkable.  Impression: This awake EEG is abnormal due to diffuse slowing of the waking background.  Clinical Correlation of the above findings indicates diffuse cerebral dysfunction that is non-specific in etiology and can be seen with hypoxic/ischemic injury, toxic/metabolic encephalopathies,  neurodegenerative disorders, or medication effect.  The absence of epileptiform discharges does not rule out a clinical diagnosis of epilepsy.  Clinical correlation is advised.   Shon MilletAdam Juanpablo Ciresi, DO

## 2016-06-22 NOTE — Evaluation (Signed)
Clinical/Bedside Swallow Evaluation Patient Details  Name: Barry Jones MRN: [id number] Date of Birth: 08/12/1931  Today's Date: 06/22/2016 Time: SLP Start Time (ACUTE ONLY): 1540 SLP Stop Time (ACUTE ONLY): 1552 SLP Time Calculation (min) (ACUTE ONLY): 12 min  Past Medical History:  Past Medical History:  Diagnosis Date  . Chronic atrial fibrillation (HCC)   . Diabetes mellitus type II, controlled (HCC)   . HTN (hypertension)    Past Surgical History: No past surgical history on file. HPI:  Pt is a 82 yo male admitted after pt starting having trouble seeing lunch plate, developed slurred speech, R gaze preference and had tonic/clonic sz activity in the L sdie. 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 Pt was admitted in 8/17 with similar symptoms, was given TPA and returned [facility] with son [facility]. Pt with h/o afib, significant dementia, DMII.CXR with LLL infiltrate.    Assessment / Plan / Recommendation Clinical Impression  Patient presents with significant lethargy impacting swallowing function at this time. Arousable but quickly falls back to sleep despite max cues. No evidence of awareness of tactile/cold stimluation to lips with ice chip or cup being brought to mouth [facility]. Will f/u in am 10/17 for po readiness.     Aspiration Risk  Severe aspiration risk    Diet Recommendation NPO   Medication Administration: Via alternative means    Other  Recommendations Oral Care Recommendations: Oral care QID   Follow up Recommendations  (TBD)      Frequency and Duration min 2x/week  2 weeks       Prognosis Prognosis for Safe Diet Advancement: Good      Swallow Study   General HPI: Pt is a 82 yo male admitted after pt starting having trouble seeing lunch plate, developed slurred speech, R gaze preference and had tonic/clonic sz activity in the L sdie. 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 Pt was admitted in 8/17 with similar symptoms, was given TPA and [facility]  [facility]. Pt with h/o afib, significant dementia, DMII.CXR with LLL infiltrate.  Type of Study: Bedside Swallow Evaluation Previous Swallow Assessment: none Diet Prior to this Study: NPO Temperature Spikes Noted: No Respiratory Status: Room air History of Recent Intubation: No Behavior/Cognition: Lethargic/Drowsy Oral Cavity Assessment: Within Functional Limits Oral Care Completed by SLP: No Oral Cavity - Dentition: Dentures, top Vision: Functional for self-feeding Self-Feeding Abilities: Total assist Patient Positioning: Upright in chair Baseline Vocal Quality: Normal Volitional Cough: Cognitively unable to elicit Volitional Swallow: Unable to elicit    Oral/Motor/Sensory Function Overall Oral Motor/Sensory Function: Other (comment) (unable to formally test, left sided assymetry noted)   Ice Chips Ice chips: Impaired Presentation: Spoon Oral Phase Impairments: Poor awareness of bolus   Thin Liquid Thin Liquid: Impaired Presentation: Cup Oral Phase Impairments: Poor awareness of bolus    Nectar Thick Nectar Thick Liquid: Not tested   Honey Thick Honey Thick Liquid: Not tested   Puree Puree: Not tested   Solid   GO  Barry Gaul MA, CCC-SLP [phone]  Solid: Not tested        Barry Jones 06/22/2016,4:02 PM

## 2016-06-22 NOTE — Consult Note (Addendum)
Neurology Consult Note  Reason for Consultation: Altered mental status, seizure activity  Requesting provider: Madelyn Flavors, MD, Attending Dr. Onalee Hua tat  Interval History 06/22/2016:  Sons at bedside.  Agitated and confused. At baseline he has dementia but interactive and not confused. He is talking to sons today and sometimes answers but overall he is not oriented, he is somewhat alert. Per sons, he started slurring words, very confused, acute onset symptoms. No events overnight per nursing.  He is on eliquis for recent embolic stroke and afib and family endorses compliance.   Original HPI 06/22/2016: This is an [name] who is transferred from OSH for further evaluation of possible seizure versus stroke.   He was seen at [facility] ED today after change in mental status at home. His son reports that he was having trouble seeing what was on his plate while he was trying to eat lunch and then started staring off into space. His speech became slurred, and stopped speaking, and he developed a gaze preference. He was taken to the ED where CT was interpreted as showing an acute nonhemorrhagic infarct in the high right parietal lobe with stable left PCA territory encephalomalacia. On ED MD exam, he was noted to be confused with minor facial weakness with NIHSS of 2. Teleneurology consultation was obtained and they recommended MRI and EEG as they felt this could be either seizure or acute stroke. He was transferred to [facility] for further evaluation because [facility] has no available neurology coverage.    From [facility] today: CBC unremarkable  CMP BUN 32, glucose 117 Troponin-I <0.01 CTH without contrast as noted above  Dr. Roxy Manns: I was called by the patient's RN that he was having stiffening of the left leg with left head turn. On my arrival to the bedside, he was having persistent tonic-clonic activity of the left arm and leg with his head turned up and to the left. No eye deviation.  He actively resisted eye opening, saying "Damn it" when I checked his pupils. He had purposeful movement of the right side. He was loaded with fosphenytoin 15 PE/kg with resolution of the continuous seizure but he continued to have occasional brief focal seizures on the left. He was then loaded with Keppra 1500 mg IV.   His family at the bedside report that he had as stroke in August 2017 that resulted in left-sided weakness. He recovered well, however, and received home PT/OT with minimal residual deficits. On review of the chart, he was admitted to [name] from 8/12-8/15/17. He presented at the time with the acute onset of left hemiparesis and R gaze. He was given IV tPA with improvement in symptoms after infusion. Workup revealed scattered infarcts in the R MCA territory, the largest of which was in the R parietal lobe. He had some asymptomatic hemorrhagic transformation of his strokes after tPA. It was felt that his strokes were the result of atrial fibrillation for which he was not anticoagulated. At the time of discharge, it was recommended that he have a repeat CT of the head in three weeks and if no hemorrhage was seen that he be started on Eliquis for secondary stroke prevention. Discharge examination documented normal strength in all extremities with dysmetria and postural tremor in the R hand. He has an upcoming follow-up appointment with Dr. Roda Shutters, vascular neurology, on 07/07/16.     PMH:  1. Atrial fibrillation 2. DM 3. HTN 4. 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 Dementia 6. Hearing  loss  PSH:  No past surgical history on file.  Family history: None reported  Social history:  He is widowed. He lives with his son. They report that he is independent with the majority his basic activities of daily living. However, they state that his short-term memory is quite poor and he requires supervision. No reported tobacco, alcohol, or illicit drug use.   Current outpatient meds: Current Meds   Medication Sig  . atenolol (TENORMIN) 25 MG tablet Take 25 mg by mouth 3 (three) times daily.   Marland Kitchen atorvastatin (LIPITOR) 20 MG tablet Take 1 tablet (20 mg total) by mouth daily at 6 PM.  . Cyanocobalamin (B-12 PO) Take 1 tablet by mouth daily.  Marland Kitchen ELIQUIS 5 MG TABS tablet Take 5 mg by mouth daily.  . furosemide (LASIX) 40 MG tablet Take 40 mg by mouth daily.  Marland Kitchen MELATONIN PO Take 1 tablet by mouth daily.  . metFORMIN (GLUCOPHAGE) 500 MG tablet Take 500 mg by mouth 2 (two) times daily with a meal.  . nitrofurantoin (MACRODANTIN) 100 MG capsule Take 100 mg by mouth daily.    Current inpatient meds:  Current Facility-Administered Medications  Medication Dose Route Frequency Provider Last Rate Last Dose  .  stroke: mapping our early stages of recovery book   Does not apply Once Clydie Braun, MD      . 0.9 %  sodium chloride infusion   Intravenous Continuous Clydie Braun, MD 75 mL/hr at 06/21/16 2100    . apixaban (ELIQUIS) tablet 5 mg  5 mg Oral BID Clydie Braun, MD      . atenolol (TENORMIN) tablet 25 mg  25 mg Oral TID Clydie Braun, MD      . atorvastatin (LIPITOR) tablet 20 mg  20 mg Oral q1800 Rondell A Katrinka Blazing, MD      . haloperidol lactate (HALDOL) injection 5 mg  5 mg Intravenous Q6H PRN Clydie Braun, MD   5 mg at 06/21/16 2040  . insulin aspart (novoLOG) injection 0-9 Units  0-9 Units Subcutaneous TID WC Rondell A Katrinka Blazing, MD      . LORazepam (ATIVAN) injection 1 mg  1 mg Intravenous Once Versie Starks, MD      . LORazepam (ATIVAN) injection 1-2 mg  1-2 mg Intravenous BID PRN Clydie Braun, MD      . phenytoin (DILANTIN) injection 100 mg  100 mg Intravenous Q8H Versie Starks, MD        Allergies: Allergies  Allergen Reactions  . Ciprofloxacin Other (See Comments)    Made pt crazy    ROS: As per HPI. A full 14-point review of systems Cannot be obtained at this time due to encephalopathy  PE:  BP (!) 157/87 (BP Location: Left Arm)   Pulse 72   Temp  97.7 F (36.5 C) (Axillary)   Resp 18   Wt 70.4 kg (155 lb 3.3 oz)   SpO2 100%   General: Elderly Caucasian man lying in bed. Opens eyes to voice.   HEENT: Normocephalic. Neck supple without LAD. Sclerae anicteric. No conjunctival injection.   CV: Irregularly irregular, no obvious murmur. Carotid pulses full and symmetric, no bruits. Distal pulses 2+ and symmetric.  Lungs: CTAB on anterior auscultation.  Abdomen: Soft, non-distended, no rebound or guarding. Bowel sounds present x4.  Extremities: No C/C/E. Neuro:  CN: His left pupil is irregular and appears post surgical. He opens his eyes to voice. Not following commands. He appears alert  and blinks to threat bilaterally.  Conjugate gaze, does not appear to have gaze preference and can cross the midline. Lateral eye movements appear intact, he will look laterally to sons on voice command bilaterally. Good eye closure strength. Left nasolabial flattening. Moving all extremities mostly on the right side. Moving uppers purposely. Moving right leg spontaneously. Moves left leg to stim. No ongoing seizure activity apparent. Further neuro testing impaired by encephalopathy. No dysmetria is noted however limited exam.    Labs:  Lab Results  Component Value Date   WBC 7.1 06/22/2016   HGB 13.8 06/22/2016   HCT 42.1 06/22/2016   PLT 155 06/22/2016   GLUCOSE 121 (H) 06/22/2016   CHOL 151 04/19/2016   TRIG 51 04/19/2016   HDL 45 04/19/2016   LDLCALC 96 04/19/2016   NA 142 06/22/2016   K 3.5 06/22/2016   CL 101 06/22/2016   CREATININE 0.98 06/22/2016   BUN 24 (H) 06/22/2016   CO2 30 06/22/2016   TSH 0.996 04/20/2016   HGBA1C 6.4 (H) 04/19/2016    Imaging:  CTH from OSH results as noted in HPI above.   Other diagnostic studies:  TTE 04/20/16: LV normal size; mild LVH; EF 55%; normal wall motion; mild aortic regurgitation; mild calcification of the mitral annulus; severe dilation of both atria; moderate tricuspid regurgitation  Carotid  Dopplers 04/19/16: No hemodynamically significant stenosis.   EEG 06/22/2016: no epileptiform activity, slowed likely due to metabolic encephalopathy and/or dementia  Impression: This awake EEG is abnormal due to diffuse slowing of the waking background.  Clinical Correlation of the above findings indicates diffuse cerebral dysfunction that is non-specific in etiology and can be seen with hypoxic/ischemic injury, toxic/metabolic encephalopathies, neurodegenerative disorders, or medication effect.  The absence of epileptiform discharges does not rule out a clinical diagnosis of epilepsy.  Clinical correlation is advised.  Assessment and Plan:  1. Complex partial status epilepticus: Per report, his seizures involved the left side of his body with a left head turn and are consistent with his recent right MCA territory stroke being the focus.  He was loaded with fosphenytoin with significant reduction in his seizure activity by overnight neurohospitalist, though due to occasional brief episodes of ongoing partial seizure he was also loaded with Keppra 1500 mg IV.Will continue Keppra. Dilantin interaction with eliquis, this was stopped. Seizure precautions.  EEG 06/22/2016:   2. Cerebrovascular disease: He had a right MCA territory infarct in August 2017 that was felt to be cardioembolic in nature due to underlying atrial fibrillation. He was recently started on Eliquis for secondary prevention and this can be continued. Continue aggressive management of underlying risk factors, including diabetes, hypertension, and lipids. Recommend  MRI scan of the brain for further evaluation, though this is not likely to change overall management. Will order MRI of the brain w/ contrast however unclear if this can be completed given his agitation/confusion.  If not can repeat CT of the head.   3. Dementia: His family describes what sounds like a fairly significant underlying dementia. This unfortunately places him at  increased risk for delirium from all causes and could predict delayed and possibly incomplete recovery from the same. He was showing some evidence of delirium prior to tonight's seizures according to his nurse. Avoid CNS active medications, particularly benzodiazepines, opiates, and anything with strong anticholinergic properties. I will reserve benzodiazepines for refractory seizure. If agitation is an issue, recommend low doses of haloperidol or an atypical neuroleptic as needed.  This was all discussed  with the patient's family at the bedside at the time of my visit. They are in agreement with the plan as noted above, both sons at bedside. They were given the opportunity to ask any questions and these were addressed to their satisfaction.  Personally examined patient and images, and have participated in and made any corrections needed to history, physical, neuro exam,assessment and plan as stated above.  I have personally obtained the history, evaluated lab date, reviewed imaging studies and agree with radiology interpretations.    Naomie DeanAntonia Deshay Kirstein, MD [facility] Neu[facility] [phone] [facility]  A total of 35 minutes was spent face-to-face with this patient providing care. Over half this time was spent on counseling patient and family  on the seizure diagnosis and different diagnostic and therapeutic options available.

## 2016-06-22 NOTE — Evaluation (Signed)
Physical Therapy Evaluation Patient Details Name: Cletus GashDallas Baranski MRN: [id number] DOB: 08/23/1931 Today's Date: 06/22/2016   History of Present Illness  Pt is a 82 yo male admitted after pt starting having trouble seeing lunch plate, developed slurred speech, R gaze preference and had tonic/clonic sz activity in the L sdie.  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 Pt was admitted in 8/17 with similar symptoms, was given TPA and [facility] Pt with h/o afib, significant dementia, DMII.  Clinical Impression  Pt admitted with above. Pt indep with mobility and ADLs PTA, supervision for cognition. Pt now mod/maxAx2 for all mobility. Pt greatly limited by lethargy and inability to follow commands. As of now recommending SNF upon d/c however if pt becomes more alert and cooperative with PT will reassess d/c recommendations.    Follow Up Recommendations SNF;Supervision/Assistance - 24 hour    Equipment Recommendations  None recommended by PT    Recommendations for Other Services       Precautions / Restrictions Precautions Precautions: Fall Precaution Comments: seizures Restrictions Weight Bearing Restrictions: No      Mobility  Bed Mobility Overal bed mobility: Needs Assistance;+2 for physical assistance Bed Mobility: Rolling;Supine to Sit Rolling: Max assist (max directional v/c's)   Supine to sit: +2 for physical assistance     General bed mobility comments: pt requiring a great amount of assist to move in bed. Pt wet on arrival with condom catheter off.  Pt rolled side to side to be cleaned with max assist x2.  Transfers Overall transfer level: Needs assistance Equipment used: 2 person hand held assist Transfers: Sit to/from UGI CorporationStand;Stand Pivot Transfers Sit to Stand: Max assist;+2 physical assistance Stand pivot transfers: Max assist;+2 physical assistance       General transfer comment: Pt starts off well and slowly gets weaker when standing. Pt with L  lean.. max verbal cues for stepping pattern  Ambulation/Gait                Stairs            Wheelchair Mobility    Modified Rankin (Stroke Patients Only)       Balance Overall balance assessment: Needs assistance Sitting-balance support: Feet supported Sitting balance-Leahy Scale: Poor Sitting balance - Comments: pt could sit for very short brief moments with CG but had heavy L lean sometimes needing total assist to recover. Postural control: Left lateral lean Standing balance support: Bilateral upper extremity supported Standing balance-Leahy Scale: Zero Standing balance comment: pt wanted to stand to urinate. pt maxAx2 to maintain standing x 5 min to urinate. pt with strong L Lateral lean, max v/c's to achieve knee extension                             Pertinent Vitals/Pain Pain Assessment: Faces Pain Score: 0-No pain Faces Pain Scale: No hurt    Home Living Family/patient expects to be discharged to:: Private residence Living Arrangements: Alone Available Help at Discharge: Family;Available 24 hours/day Type of Home: House Home Access: Stairs to enter Entrance Stairs-Rails: None Entrance Stairs-Number of Steps: 1 Home Layout: One level Home Equipment: Walker - 2 wheels;Bedside commode Additional Comments: Pt generally sponge bathes only.    Prior Function Level of Independence: Needs assistance   Gait / Transfers Assistance Needed: has walker but doesn't use it  ADL's / Homemaking Assistance Needed: family sets out clothes but pt dresses, baths and feeds self,  son pays bills  Comments: Pt reports he still drives occasionally. Reports history of falls at home PTA. Pt reports he ambulates without AD, does laundry, eats out most of the time, cleans the house, children assist with yardwork.      Hand Dominance   Dominant Hand: Right    Extremity/Trunk Assessment   Upper Extremity Assessment: Defer to OT evaluation           Lower  Extremity Assessment: Difficult to assess due to impaired cognition (pt with voluntary movement and able to stand)      Cervical / Trunk Assessment: Normal  Communication   Communication: HOH  Cognition Arousal/Alertness: Lethargic Behavior During Therapy: Flat affect;Restless Overall Cognitive Status: Impaired/Different from baseline Area of Impairment: Orientation;Attention;Memory;Following commands;Safety/judgement;Awareness;Problem solving Orientation Level: Disoriented to;Time;Situation;Place Current Attention Level: Focused Memory: Decreased recall of precautions;Decreased short-term memory Following Commands: Follows one step commands inconsistently Safety/Judgement: Decreased awareness of safety;Decreased awareness of deficits Awareness: Intellectual Problem Solving: Slow processing;Decreased initiation;Requires verbal cues;Requires tactile cues General Comments: Pt has dementia at baseline but did fuction with mainly supervision from sons.    General Comments General comments (skin integrity, edema, etc.): pt wet on arrival, dependent for hygiene    Exercises     Assessment/Plan    PT Assessment Patient needs continued PT services  PT Problem List Decreased strength;Decreased range of motion;Decreased activity tolerance;Decreased balance;Decreased coordination;Decreased cognition;Decreased mobility;Decreased knowledge of use of DME;Decreased safety awareness;Decreased knowledge of precautions          PT Treatment Interventions DME instruction;Gait training;Stair training;Functional mobility training;Balance training;Therapeutic activities;Therapeutic exercise    PT Goals (Current goals can be found in the Care Plan section)  Acute Rehab PT Goals Patient Stated Goal: none stated. PT Goal Formulation: With family Time For Goal Achievement: 07/06/16 Potential to Achieve Goals: Good    Frequency Min 4X/week   Barriers to discharge        Co-evaluation PT/OT/SLP  Co-Evaluation/Treatment: Yes Reason for Co-Treatment: Complexity of the patient's impairments (multi-system involvement);For patient/therapist safety PT goals addressed during session: Mobility/safety with mobility OT goals addressed during session: ADL's and self-care       End of Session Equipment Utilized During Treatment: Gait belt Activity Tolerance: Patient limited by lethargy Patient left: in chair;with call bell/phone within reach;with chair alarm set;with family/visitor present Nurse Communication: Mobility status         Time: 1610-9604 PT Time Calculation (min) (ACUTE ONLY): 35 min   Charges:   PT Evaluation $PT Eval Moderate Complexity: 1 Procedure     PT G CodesMarcene Brawn 06/22/2016, 1:27 PM   Lewis Shock, PT, DPT Pager #: [phone] Office #: [phone]

## 2016-06-22 NOTE — Evaluation (Signed)
Occupational Therapy Evaluation Patient Details Name: Barry GashDallas Jones MRN: [id number] DOB: 04/01/1931 Today's Date: 06/22/2016    History of Present Illness Pt is a 82 yo male admitted after pt starting having trouble seeing lunch plate, developed slurred speech, R gaze preference and had tonic/clonic sz activity in the L sdie.  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 Pt was admitted in 8/17 with similar symptoms, was given TPA and [facility]. Pt with h/o afib, significant dementia, DMII.   Clinical Impression   Pt admitted with the above diagnosis and has the deficits listed below. Pt would benefit from cont OT to increase independence in basic adls and adl transfers so pt can eventually return home with assist from his sons.  Pt currently lives with sons and has 24 hour S but now will need 24 hour assist.  Feel pt would benefit from SNF at this time for rehab due to poor ability to follow commands and h/o dementia.  If pt improves, he may appropriate for inpatient rehab.    Follow Up Recommendations  SNF;Supervision/Assistance - 24 hour    Equipment Recommendations  None recommended by OT    Recommendations for Other Services       Precautions / Restrictions Precautions Precautions: Fall Precaution Comments: seizures Restrictions Weight Bearing Restrictions: No      Mobility Bed Mobility Overal bed mobility: Needs Assistance;+2 for physical assistance Bed Mobility: Rolling;Supine to Sit Rolling: Max assist   Supine to sit: +2 for physical assistance     General bed mobility comments: pt requiring a great amount of assist to move in bed. Pt wet on arrival with condom catheter off.  Pt rolled side to side to be cleaned with max assist x2.  Transfers Overall transfer level: Needs assistance Equipment used: 2 person hand held assist Transfers: Sit to/from [employer] CorporationStand;Stand Pivot Transfers Sit to Stand: Max assist;+2 physical assistance Stand pivot  transfers: Max assist;+2 physical assistance       General transfer comment: Pt starts off well and slowly gets weaker when standing. Pt with L lean.    Balance Overall balance assessment: Needs assistance Sitting-balance support: Feet supported Sitting balance-Leahy Scale: Poor Sitting balance - Comments: pt could sit for very short brief moments with CG but had heavy L lean sometimes needing total assist to recover. Postural control: Left lateral lean Standing balance support: Bilateral upper extremity supported;During functional activity Standing balance-Leahy Scale: Zero Standing balance comment: Pt cannot stand without full external support of two.                            ADL Overall ADL's : Needs assistance/impaired     Grooming: Total assistance;Sitting Grooming Details (indicate cue type and reason): pt very lethargic Upper Body Bathing: Total assistance;Sitting   Lower Body Bathing: Total assistance;Sit to/from stand   Upper Body Dressing : Total assistance;Sitting   Lower Body Dressing: Total assistance;Sit to/from stand   Toilet Transfer: +2 for physical assistance;Maximal assistance;Stand-pivot;BSC   Toileting- Clothing Manipulation and Hygiene: Total assistance;+2 for physical assistance;Sit to/from stand (with urinal)       Functional mobility during ADLs: Maximal assistance;+2 for physical assistance;+2 for safety/equipment General ADL Comments: Pt very lethargic and not following many commands.  Currently pt is needing max to total assist with most adls. Pt leans to the left in sitting and in standing requiring increased assist due to being a fall risk.  Vision Vision Assessment?: Vision impaired- to be further tested in functional context   Perception Perception Perception Tested?: No   Praxis Praxis Praxis tested?: Not tested    Pertinent Vitals/Pain Pain Assessment: Faces Pain Score: 0-No pain Faces Pain Scale: No hurt      Hand Dominance Right   Extremity/Trunk Assessment Upper Extremity Assessment Upper Extremity Assessment: Overall WFL for tasks assessed   Lower Extremity Assessment Lower Extremity Assessment: Defer to PT evaluation   Cervical / Trunk Assessment Cervical / Trunk Assessment: Normal   Communication Communication Communication: HOH   Cognition Arousal/Alertness: Lethargic Behavior During Therapy: Flat affect;Restless Overall Cognitive Status: Impaired/Different from baseline Area of Impairment: Orientation;Attention;Memory;Following commands;Safety/judgement;Awareness;Problem solving Orientation Level: Disoriented to;Time;Situation;Place Current Attention Level: Focused Memory: Decreased recall of precautions;Decreased short-term memory Following Commands: Follows one step commands inconsistently Safety/Judgement: Decreased awareness of safety;Decreased awareness of deficits Awareness: Intellectual Problem Solving: Slow processing;Decreased initiation;Requires verbal cues;Requires tactile cues General Comments: Pt has dementia at baseline but did fuction with mainly supervision from sons.   General Comments       Exercises       Shoulder Instructions      Home Living Family/patient expects to be discharged to:: Private residence Living Arrangements: Alone Available Help at Discharge: Family;Available 24 hours/day Type of Home: House Home Access: Stairs to enter [employer] of Steps: 1 Entrance Stairs-Rails: None Home Layout: One level     Bathroom Shower/Tub: Tub/shower unit Shower/tub characteristics: Curtain Firefighter: Handicapped height Bathroom Accessibility: Yes   Home Equipment: Environmental consultant - 2 wheels;Bedside commode   Additional Comments: Pt generally sponge bathes only.      Prior Functioning/Environment Level of Independence: Needs assistance  Gait / Transfers Assistance Needed: has walker but doesn't use it ADL's / Homemaking  Assistance Needed: family sets out clothes but pt dresses, baths and feeds self, son pays bills Communication / Swallowing Assistance Needed: HOH          OT Problem List: Decreased activity tolerance;Impaired balance (sitting and/or standing);Impaired vision/perception;Decreased safety awareness;Decreased cognition;Decreased knowledge of use of DME or AE;Decreased knowledge of precautions   OT Treatment/Interventions: Self-care/ADL training;DME and/or AE instruction;Therapeutic activities    OT Goals(Current goals can be found in the care plan section) Acute Rehab OT Goals Patient Stated Goal: none stated. OT Goal Formulation: With family Time For Goal Achievement: 07/06/16 Potential to Achieve Goals: Fair ADL Goals Pt Will Perform Eating: with supervision;sitting Pt Will Perform Grooming: with mod assist;sitting Pt Will Perform Upper Body Bathing: with min assist;sitting Pt Will Perform Upper Body Dressing: with min assist;sitting Pt Will Transfer to Toilet: with mod assist;bedside commode Additional ADL Goal #1: Pt will follow commands 75% of the time to increase participation in OT services. Additional ADL Goal #2: Pt will state two things he would like to do independently at home to increase participation in goal setting.  OT Frequency: Min 2X/week   Barriers to D/C:    sons can provide 24 hour S.       Co-evaluation PT/OT/SLP Co-Evaluation/Treatment: Yes Reason for Co-Treatment: For patient/therapist safety PT goals addressed during session: Mobility/safety with mobility OT goals addressed during session: ADL's and self-care      End of Session Equipment Utilized During Treatment: Gait belt Nurse Communication: Mobility status;Other (comment) (needs condom cath replaced.)  Activity Tolerance: Patient limited by lethargy Patient left: in chair;with call bell/phone within reach;with chair alarm set;with family/visitor present   Time: 7829-5621 OT Time Calculation  (min): 29 min Charges:  OT General Charges $OT  Visit: 1 Procedure OT Evaluation $OT Eval Moderate Complexity: 1 Procedure G-Codes:    Hope Budds 07-02-2016, 10:39 AM  [phone]

## 2016-06-22 NOTE — Discharge Instructions (Signed)

## 2016-06-22 NOTE — Progress Notes (Signed)
EEG completed, results pending. 

## 2016-06-22 NOTE — Progress Notes (Signed)
Do to interactions of Eliquis with Dilantin will change to Keppra 750 mg BID and stop Dilantin.  Will continue to follow.   Felicie MornDavid Smith PA-C [facility] [phone]  06/22/2016, 1:52 PM

## 2016-06-22 NOTE — Care Management Note (Signed)
Case Management Note  Patient Details  Name: Barry Jones MRN: [id number] Date of Birth: 02/03/1931  Subjective/Objective:   Pt admitted with acute encephalopathy. He is from home with his son.                  Action/Plan: OT recommending SNF. Awaiting PT rec. CM following for discharge disposition.   Expected Discharge Date:                  Expected Discharge Plan:  Skilled Nursing Facility  In-House Referral:     Discharge planning Services     Post Acute Care Choice:    Choice offered to:     DME Arranged:    DME Agency:     HH Arranged:    HH Agency:     Status of Service:  In process, will continue to follow  If discussed at Long Length of Stay Meetings, dates discussed:    Additional Comments:  [name], RN 06/22/2016, 11:35 AM

## 2016-06-23 ENCOUNTER — Inpatient Hospital Stay (HOSPITAL_COMMUNITY): Payer: Medicare Other

## 2016-06-23 DIAGNOSIS — R1310 Dysphagia, unspecified: Secondary | ICD-10-CM

## 2016-06-23 DIAGNOSIS — R1312 Dysphagia, oropharyngeal phase: Secondary | ICD-10-CM

## 2016-06-23 LAB — APTT
APTT: 35 s (ref 24–36)
APTT: 81 s — AB (ref 24–36)

## 2016-06-23 LAB — HEPARIN LEVEL (UNFRACTIONATED)
Heparin Unfractionated: 0.64 IU/mL (ref 0.30–0.70)
Heparin Unfractionated: 0.88 IU/mL — ABNORMAL HIGH (ref 0.30–0.70)

## 2016-06-23 LAB — GLUCOSE, CAPILLARY
GLUCOSE-CAPILLARY: 132 mg/dL — AB (ref 65–99)
GLUCOSE-CAPILLARY: 167 mg/dL — AB (ref 65–99)
Glucose-Capillary: 153 mg/dL — ABNORMAL HIGH (ref 65–99)
Glucose-Capillary: 124 mg/dL — ABNORMAL HIGH (ref 65–99)

## 2016-06-23 LAB — BASIC METABOLIC PANEL
Anion gap: 13 (ref 5–15)
BUN: 14 mg/dL (ref 6–20)
CALCIUM: 8.2 mg/dL — AB (ref 8.9–10.3)
CO2: 25 mmol/L (ref 22–32)
CREATININE: 0.94 mg/dL (ref 0.61–1.24)
Chloride: 101 mmol/L (ref 101–111)
GFR calc non Af Amer: >60 mL/min (ref >60)
Glucose, Bld: 149 mg/dL — ABNORMAL HIGH (ref 65–99)
Potassium: 3.5 mmol/L (ref 3.5–5.1)
SODIUM: 139 mmol/L (ref 135–145)

## 2016-06-23 LAB — MAGNESIUM: MAGNESIUM: 1.2 mg/dL — AB (ref 1.7–2.4)

## 2016-06-23 MED ORDER — ORAL CARE MOUTH RINSE
15.0000 mL | Freq: Two times a day (BID) | OROMUCOSAL | Status: DC
  Administered 2016-06-23 – 2016-06-29 (×11): 15 mL via OROMUCOSAL

## 2016-06-23 MED ORDER — HEPARIN (PORCINE) IN NACL 100-0.45 UNIT/ML-% IJ SOLN
950.0000 [IU]/h | INTRAMUSCULAR | Status: DC
  Administered 2016-06-23: 1050 [IU]/h via INTRAVENOUS
  Administered 2016-06-24: 850 [IU]/h via INTRAVENOUS
  Administered 2016-06-25 – 2016-06-28 (×3): 950 [IU]/h via INTRAVENOUS
  Filled 2016-06-23 (×5): qty 250

## 2016-06-23 MED ORDER — MAGNESIUM SULFATE 4 GM/100ML IV SOLN
4.0000 g | Freq: Once | INTRAVENOUS | Status: AC
  Administered 2016-06-23: 4 g via INTRAVENOUS
  Filled 2016-06-23: qty 100

## 2016-06-23 MED ORDER — CHLORHEXIDINE GLUCONATE 0.12 % MT SOLN
15.0000 mL | Freq: Two times a day (BID) | OROMUCOSAL | Status: DC
  Administered 2016-06-24 – 2016-06-29 (×11): 15 mL via OROMUCOSAL
  Filled 2016-06-23 (×8): qty 15

## 2016-06-23 MED ORDER — SODIUM CHLORIDE 0.9 % IV SOLN
500.0000 mg | Freq: Two times a day (BID) | INTRAVENOUS | Status: DC
  Administered 2016-06-23 – 2016-06-28 (×10): 500 mg via INTRAVENOUS
  Filled 2016-06-23 (×12): qty 5

## 2016-06-23 NOTE — Progress Notes (Signed)
Modified Barium Swallow Progress Note  Patient Details  Name: Barry Jones MRN: [id number] Date of Birth: 04/08/1931  Today's Date: 06/23/2016  Modified Barium Swallow completed.  Full report located under Chart Review in the Imaging Section.  Brief recommendations include the following:  Clinical Impression  Moderate oral and severe pharyngeal dysphagia with sensorimotor deficits.  Oral weakness/discoordination results in delayed oral transiting, decreased oral bolus cohesion with premature spillage into pharynx.    Pharyngeal swallow was delayed and weak resulting in severe pharyngeal residuals WITHOUT pt awareness.  SlP used dry spoon stimulation to tongue to elicit swallow - but this did not clear residuals.  Mild aspiration observed with nectar, thin and secretions mixed with barium without pt attempt to cough to clear.  Postures attempted but unable to be performed due to pt's difficulty following directions.     Pt is at high aspiration risk with po; pending family decisions, would recommend npo except single tsps of water for comfort/oral hygeine. concern present for swallow prognosis given significant weight loss over the last year, level of dysphagia and pt's dementia impacting rehabilitation potential.  Son Barry Jones present and SLP educated son/pt to findings/recommendations.    Advised son to cue pt to cough and swallow if he is throat clearing as this is indicative of aspiration.  Will follow for po readiness.     Swallow Evaluation Recommendations       SLP Diet Recommendations: NPO (tsps water after oral care only and when fully alert, sitting upright)       Medication Administration: Via alternative means               Oral Care Recommendations: Oral care QID        Mills KollerKimball, Taydon Nasworthy Ann Jazz Biddy, MS [facility] SLP [phone]

## 2016-06-23 NOTE — Progress Notes (Signed)
ANTICOAGULATION CONSULT NOTE - Initial Consult  Pharmacy Consult for heparin Indication: atrial fibrillation  Allergies  Allergen Reactions  . Ciprofloxacin Other (See Comments)    Made pt crazy    Patient Measurements: Weight: 155 lb 3.3 oz (70.4 kg) Heparin Dosing Weight: 70kg  Vital Signs: Temp: 97.7 F (36.5 C) (10/17 1322) Temp Source: Oral (10/17 1322) BP: 159/91 (10/17 1322) Pulse Rate: 97 (10/17 1322)  Labs:  Recent Labs  06/22/16 0345 06/23/16 0415  HGB 13.8  --   HCT 42.1  --   PLT 155  --   CREATININE 0.98 0.94    CrCl cannot be calculated (Unknown ideal weight.).   Medical History: Past Medical History:  Diagnosis Date  . Chronic atrial fibrillation (HCC)   . Diabetes mellitus type II, controlled (HCC)   . HTN (hypertension)     Assessment: [age] yom with dementia, on apixaban PTA for afib. Patient now unable to take PO - Pharmacy consulted to start heparin with no bolus. Patient has not had any doses of apixaban inpatient to this point, last dose was 10/15 per med rec. Ok to start heparin now. CBC wnl, no bleed documented.  Goal of Therapy:  Heparin level 0.3-0.7 units/ml Monitor platelets by anticoagulation protocol: Yes   Plan:  Start heparin at 1050 units/h Check baseline aPTT/heparin level 8h heparin level/aPTT Daily heparin level/aPTT/CBC Monitor for s/sx bleeding F/u anticoagulation plan   Babs BertinHaley Aquil Duhe, PharmD, BCPS Clinical Pharmacist 06/23/2016 3:15 PM

## 2016-06-23 NOTE — Progress Notes (Addendum)
PROGRESS NOTE  Barry Jones ZOX:[id number] DOB: 1930/10/10 DOA: 06/21/2016 PCP: Paulina Fusi, MD  Brief History:  82 y.o.malewith medical history significant of CVA, HTN, DMII, afib on Eliquis, and dementia; who presents with confusion and gaze preference.  His son reports that he was having trouble seeing what was on his plate while he was trying to eat lunch and then started staring off into space. His speech became slurred, and stopped speaking, and he developed a gaze preference. He was taken to the ED where CT was interpreted as showing an acute nonhemorrhagic infarct in the high right parietal lobe with stable left PCA territory encephalomalacia. The pt was transferred to [facility] where neurology was consulted.  The patient was given fosphenytoin with resolution of the continuous seizure but he continued to have occasional brief focal seizures on the left. He was then loaded with Keppra 1500 mg IV.   He had as stroke in August 2017 that resulted in left-sided weakness. He recovered well, however, and received home PT/OT with minimal residual deficits. On review of the chart, he was admitted to [facility] from 8/12-8/15/17. He presented at the time with the acute onset of left hemiparesis and R gaze. He was given IV tPA with improvement in symptoms after infusion. Workup revealed scattered infarcts in the R MCA territory, the largest of which was in the R parietal lobe. He had some asymptomatic hemorrhagic transformation of his strokes after tPA. It was felt that his strokes were the result of atrial fibrillation for which he was not anticoagulated. At the time of discharge, it was recommended that he have a repeat CT of the head in three weeks and if no hemorrhage was seen that he be started on Eliquis for secondary stroke prevention.  Assessment/Plan: Acute Encephalopathy -multifactorial including seizures and anticholinergic medications (given benadryl), ??new stroke -04/20/2016  serum B12 672 -Urinalysis negative for pyuria -04/20/2016 TSH 0.996 -Check ammonia--11 -10/171/7--more awake but not answering questions or following commands  Complex partial status epilepticus -manifested bytonic-clonic activity of the left arm and leg with his head turned up and to the left witness by neurology--consistent with his recent right MCA territory stroke being the focus.  -AEDs per neurology--d/c dilantin due to interaction with apixaban, started keppra -EEG diffuse slowing -MRI per neurology  Cerebrovascular disease--concerned about new stroke  -had a right MCA territory infarct in August 2017 that was felt to be cardioembolic in nature due to underlying atrial fibrillation -recently started on Eliquis for secondary prevention and this can be continued per neurology  -repeat MRI brain per neurology -06/23/16 CT brain--2 small Right cerebellar infarcts since 05/12/16  Dysphagia -failed MBS -remain npo -d/c apixaban -start IV heparin without bolus  Goals of Care -son/family open to GOC discussion -consult palliative medicine -remain FULL CODE pending palliative medicine discussion  LLL Infiltrate -will NOT start abx -no fever, no leukocytosis, no hypoxia -monitor clinically  Dementia:  -His family describes what sounds like a fairly significant underlying dementia.  -places him at increased risk for delirium from all causes and could predict delayed and possibly incomplete recovery from the same. -Avoid CNS active medications, particularly benzodiazepines, opiates, and anything with strong anticholinergic properties -haldol prn  Chronic Atrial Fibrillation -rate controlled -continue apixaban -change atenolol to IV lopressor -CHADSVASc = 7  Diabetes Mellitus type 2 -04/19/2016 and hemoglobin A1c 6.4 -NovoLog sliding scale -Allow for liberal glycemic control  Hyperlipidemia -Continue statin when able to take po  Dehydration -continue IVF -am  BMP  HTN -change atenolol to IV lopressor  Hypomagnesemia  -replete -am mag   Disposition Plan:  SNF vs residential hospice Family Communication:   Son updated at bedside--06/23/16--Total time spent 35 minutes.  Greater than 50% spent face to face counseling and coordinating care.   Consultants:  Neurology, palliative medicine  Code Status:  FULL--confirmed with son at bedside  DVT Prophylaxis:  apixaban-->IV heparin   Procedures: As Listed in Progress Note Above  Antibiotics: None      Subjective: Patient awakens to exam, but does not answer questions appropriately or follow commands. Review of systems unobtainable. No reports of uncontrolled pain, respiratory distress, vomiting, diarrhea.  Objective: Vitals:   06/22/16 1726 06/22/16 2200 06/23/16 0600 06/23/16 1322  BP: (!) 170/90 (!) 170/93 (!) 158/97 (!) 159/91  Pulse: 77 95 83 97  Resp: 20 20 20 20   Temp: 97.8 F (36.6 C) 97.5 F (36.4 C) 97.5 F (36.4 C) 97.7 F (36.5 C)  TempSrc: Axillary Oral Axillary Oral  SpO2: 95% 92% 99%   Weight:        Intake/Output Summary (Last 24 hours) at 06/23/16 1444 Last data filed at 06/23/16 1201  Gross per 24 hour  Intake           1877.5 ml  Output              900 ml  Net            977.5 ml   Weight change:  Exam:   General:  Pt is alert, follows commands appropriately, not in acute distress  HEENT: No icterus, No thrush, No neck mass, [place]/AT  Cardiovascular: RRR, S1/S2, no rubs, no gallops  Respiratory: poor inspiratory effort, no wheeze, diminished BS at bases  Abdomen: Soft/+BS, non tender, non distended, no guarding  Extremities: No edema, No lymphangitis, No petechiae, No rashes, no synovitis   Data Reviewed: I have personally reviewed following labs and imaging studies Basic Metabolic Panel:  Recent Labs Lab 06/22/16 0345 06/23/16 0415  NA 142 139  K 3.5 3.5  CL 101 101  CO2 30 25  GLUCOSE 121* 149*  BUN 24* 14    CREATININE 0.98 0.94  CALCIUM 8.6* 8.2*  MG  --  1.2*   Liver Function Tests:  Recent Labs Lab 06/22/16 0345  ALBUMIN 3.5   No results for input(s): LIPASE, AMYLASE in the last 168 hours.  Recent Labs Lab 06/22/16 1707  AMMONIA 11   Coagulation Profile: No results for input(s): INR, PROTIME in the last 168 hours. CBC:  Recent Labs Lab 06/22/16 0345  WBC 7.1  NEUTROABS 4.8  HGB 13.8  HCT 42.1  MCV 93.3  PLT 155   Cardiac Enzymes: No results for input(s): CKTOTAL, CKMB, CKMBINDEX, TROPONINI in the last 168 hours. BNP: Invalid input(s): POCBNP CBG:  Recent Labs Lab 06/22/16 1241 06/22/16 1705 06/22/16 2115 06/23/16 0649 06/23/16 1139  GLUCAP 104* 107* 116* 153* 132*   HbA1C: No results for input(s): HGBA1C in the last 72 hours. Urine analysis:    Component Value Date/Time   COLORURINE AMBER (A) 06/22/2016 0022   APPEARANCEUR CLEAR 06/22/2016 0022   LABSPEC 1.012 06/22/2016 0022   PHURINE 6.0 06/22/2016 0022   GLUCOSEU NEGATIVE 06/22/2016 0022   HGBUR NEGATIVE 06/22/2016 0022   BILIRUBINUR NEGATIVE 06/22/2016 0022   KETONESUR NEGATIVE 06/22/2016 0022   PROTEINUR NEGATIVE 06/22/2016 0022   NITRITE NEGATIVE 06/22/2016 0022   LEUKOCYTESUR TRACE (A) 06/22/2016 0022  Sepsis Labs: @LABRCNTIP (procalcitonin:4,lacticidven:4) )No results found for this or any previous visit (from the past 240 hour(s)).   Scheduled Meds: .  stroke: mapping our early stages of recovery book   Does not apply Once  . apixaban  5 mg Oral BID  . atorvastatin  20 mg Oral q1800  . chlorhexidine  15 mL Mouth Rinse BID  . insulin aspart  0-9 Units Subcutaneous TID WC  . levETIRAcetam  750 mg Intravenous Q12H  . LORazepam  1 mg Intravenous Once  . mouth rinse  15 mL Mouth Rinse q12n4p  . metoprolol  2.5 mg Intravenous Q6H   Continuous Infusions: . sodium chloride 75 mL/hr at 06/23/16 0601    Procedures/Studies: Ct Head Wo Contrast  Result Date: 06/23/2016 CLINICAL  DATA:  Acute encephalopathy. EXAM: CT HEAD WITHOUT CONTRAST TECHNIQUE: Contiguous axial images were obtained from the base of the skull through the vertex without intravenous contrast. COMPARISON:  05/12/2016 FINDINGS: Brain: 2 small areas of low-density in the peripheral right cerebellum are new from 05/12/2016 head CT and brain MRI 04/19/2016. No interval supratentorial infarction. Expected evolution of right parietal infarct with resolved high-density and decreasing mass effect. Remote left inferior occipital infarct with dense gliosis. Atrophy with ventriculomegaly. Mild small vessel ischemic change in the cerebral white matter. Remote lacunar infarct in the left thalamus. Vascular: Atherosclerotic calcification. Skull: Negative Sinuses/Orbits: Bilateral cataract resection.  No acute finding. IMPRESSION: 1. Two small right cerebellar infarcts have occurred since comparison 05/12/2016. 2. Expected evolution of recent right parietal infarct. Superimposed hemorrhage has resolved. 3. Remote left occipital infarct. 4. Atrophy and ventriculomegaly. Electronically Signed   By: Marnee SpringJonathon  Watts M.D.   On: 06/23/2016 12:27   Dg Chest Port 1 View  Result Date: 06/22/2016 CLINICAL DATA:  Shortness of breath. EXAM: PORTABLE CHEST 1 VIEW COMPARISON:  No prior. FINDINGS: Mediastinum and hilar structures are unremarkable. Cardiomegaly. Mild pulmonary vascular prominence. Low lung volumes. Left lower lobe infiltrate noted. No prominent pleural effusion or pneumothorax. IMPRESSION: 1. Left lower lobe infiltrate.  Low lung volumes. 2. Cardiomegaly with mild pulmonary vascular prominence. Electronically Signed   By: Maisie Fushomas  Register   On: 06/22/2016 06:48   Dg Swallowing Func-speech Pathology  Result Date: 06/23/2016 Objective Swallowing Evaluation: Type of Study: MBS-Modified Barium Swallow Study Patient Details Name: Barry GashDallas Jones MRN: [id number] Date of Birth: 09/19/1930 Today's Date: 06/23/2016 Time: SLP Start Time  (ACUTE ONLY): 0955-SLP Stop Time (ACUTE ONLY): 1025 SLP Time Calculation (min) (ACUTE ONLY): 30 min Past Medical History: Past Medical History: Diagnosis Date . Chronic atrial fibrillation (HCC)  . Diabetes mellitus type II, controlled (HCC)  . HTN (hypertension)  Past Surgical History: No past surgical history on file. HPI: Pt is a 82 yo male admitted after pt starting having trouble seeing lunch plate, developed slurred speech, R gaze preference and had tonic/clonic sz activity in the L side. Pt found to have right parietal CVA per CT HEAD SEPT 2017.    Pt for MRI today.  CXR showed LLL pna.  Pt was also admitted with similar symptoms 04/2016, was given TPA and [facility] Pt resides with son and son reports pt was only "milldy demented" prior to admission.  Pt with h/o afib, significant dementia, DMII.    Subjective: pt awake in chair Assessment / Plan / Recommendation CHL IP CLINICAL IMPRESSIONS 06/23/2016 Therapy Diagnosis Moderate oral phase dysphagia;Severe pharyngeal phase dysphagia Clinical Impression Moderate oral and severe pharyngeal dysphagia with sensorimotor deficits.  Oral weakness/discoordination  results in delayed oral transiting, decreased oral bolus cohesion with premature spillage into pharynx.  Pharyngeal swallow was delayed and weak resulting in severe pharyngeal residuals WITHOUT pt awareness.  SlP used dry spoon stimulation to tongue to elicit swallow - but this did not clear residuals.  Mild aspiration observed with nectar, thin and secretions mixed with barium without pt attempt to cough to clear.  Postures attempted but unable to be performed due to pt's difficulty following directions.   Pt is at high aspiration risk with po; pending family decisions, would recommend npo except single tsps of water for comfort/oral hygeine.  Son Thayer Ohm present and SLP educated son/pt to findings/recommendations.  Advised son to cue pt to cough and swallow if he is throat  clearing as this is indicative of aspiration.  Will follow for po readiness.   Impact on safety and function Severe aspiration risk   CHL IP TREATMENT RECOMMENDATION 06/23/2016 Treatment Recommendations Therapy as outlined in treatment plan below   Prognosis 06/23/2016 Prognosis for Safe Diet Advancement Fair Barriers to Reach Goals Severity of deficits;Cognitive deficits;Other (Comment) Barriers/Prognosis Comment -- CHL IP DIET RECOMMENDATION 06/23/2016 SLP Diet Recommendations NPO Liquid Administration via -- Medication Administration Via alternative means Compensations -- Postural Changes --   CHL IP OTHER RECOMMENDATIONS 06/23/2016 Recommended Consults -- Oral Care Recommendations Oral care QID Other Recommendations --   CHL IP FOLLOW UP RECOMMENDATIONS 06/23/2016 Follow up Recommendations Home health SLP   CHL IP FREQUENCY AND DURATION 06/23/2016 Speech Therapy Frequency (ACUTE ONLY) min 2x/week Treatment Duration 2 weeks      CHL IP ORAL PHASE 06/23/2016 Oral Phase Impaired Oral - Pudding Teaspoon -- Oral - Pudding Cup -- Oral - Honey Teaspoon -- Oral - Honey Cup -- Oral - Nectar Teaspoon Weak lingual manipulation;Reduced posterior propulsion;Delayed oral transit;Decreased bolus cohesion;Premature spillage Oral - Nectar Cup -- Oral - Nectar Straw -- Oral - Thin Teaspoon Reduced posterior propulsion;Weak lingual manipulation;Delayed oral transit;Premature spillage Oral - Thin Cup Weak lingual manipulation;Reduced posterior propulsion;Delayed oral transit;Premature spillage Oral - Thin Straw Weak lingual manipulation;Premature spillage;Reduced posterior propulsion;Delayed oral transit Oral - Puree Weak lingual manipulation;Delayed oral transit;Reduced posterior propulsion;Premature spillage Oral - Mech Soft -- Oral - Regular -- Oral - Multi-Consistency -- Oral - Pill -- Oral Phase - Comment --  CHL IP PHARYNGEAL PHASE 06/23/2016 Pharyngeal Phase Impaired Pharyngeal- Pudding Teaspoon -- Pharyngeal -- Pharyngeal-  Pudding Cup -- Pharyngeal -- Pharyngeal- Honey Teaspoon -- Pharyngeal -- Pharyngeal- Honey Cup -- Pharyngeal -- Pharyngeal- Nectar Teaspoon Delayed swallow initiation-vallecula;Reduced pharyngeal peristalsis;Reduced epiglottic inversion;Reduced anterior laryngeal mobility;Reduced laryngeal elevation;Reduced airway/laryngeal closure;Pharyngeal residue - valleculae;Pharyngeal residue - pyriform;Trace aspiration;Penetration/Apiration after swallow Pharyngeal Material enters airway, passes BELOW cords without attempt by patient to eject out (silent aspiration) Pharyngeal- Nectar Cup -- Pharyngeal -- Pharyngeal- Nectar Straw -- Pharyngeal -- Pharyngeal- Thin Teaspoon Delayed swallow initiation-vallecula;Reduced airway/laryngeal closure;Reduced tongue base retraction;Reduced laryngeal elevation;Reduced anterior laryngeal mobility;Reduced epiglottic inversion;Reduced pharyngeal peristalsis;Pharyngeal residue - valleculae;Pharyngeal residue - pyriform Pharyngeal -- Pharyngeal- Thin Cup Delayed swallow initiation-vallecula;Reduced airway/laryngeal closure;Reduced pharyngeal peristalsis;Reduced epiglottic inversion;Reduced anterior laryngeal mobility;Reduced laryngeal elevation;Reduced tongue base retraction;Pharyngeal residue - valleculae;Pharyngeal residue - pyriform;Penetration/Aspiration during swallow;Penetration/Apiration after swallow Pharyngeal -- Pharyngeal- Thin Straw Delayed swallow initiation-vallecula;Reduced airway/laryngeal closure;Reduced tongue base retraction;Reduced epiglottic inversion;Reduced pharyngeal peristalsis;Reduced anterior laryngeal mobility;Reduced laryngeal elevation;Penetration/Aspiration during swallow;Penetration/Apiration after swallow;Trace aspiration Pharyngeal Material enters airway, passes BELOW cords without attempt by patient to eject out (silent aspiration) Pharyngeal- Puree Reduced pharyngeal peristalsis;Reduced epiglottic inversion;Reduced anterior laryngeal mobility;Reduced  laryngeal elevation;Reduced airway/laryngeal closure;Reduced tongue base retraction;Pharyngeal residue -  valleculae Pharyngeal -- Pharyngeal- Mechanical Soft -- Pharyngeal -- Pharyngeal- Regular -- Pharyngeal -- Pharyngeal- Multi-consistency -- Pharyngeal -- Pharyngeal- Pill -- Pharyngeal -- Pharyngeal Comment various postures attempted including chin tuck and head turn left *to weak side* without pt ability to perform adequately; pt did cough x1 on command and reflexively swallowed 1/4 opportunities  CHL IP CERVICAL ESOPHAGEAL PHASE 06/23/2016 Cervical Esophageal Phase Impaired Pudding Teaspoon -- Pudding Cup -- Honey Teaspoon -- Honey Cup -- Nectar Teaspoon -- Nectar Cup -- Nectar Straw -- Thin Teaspoon -- Thin Cup -- Thin Straw -- Puree -- Mechanical Soft -- Regular -- Multi-consistency -- Pill -- Cervical Esophageal Comment secretion retention noted at pyriform sinus that mixed with barium without pt awareness, upon esophageal sweep at end of study, pt appeared with mild barium retention (also mixed with secretions) at distal region without awareness, SLP questions if this could contribute to pt's chronic "throat clearing" reported by his son No flowsheet data found. Mickie Bail [place], [place] [facility] SLP [phone]               Anias Bartol, DO  [facility] Pager [phone]  If 7PM-7AM, please contact night-coverage [email or web address] Password [email or web address] 06/23/2016, 2:44 PM   LOS: 2 days

## 2016-06-23 NOTE — Progress Notes (Signed)
Neurology Consult Note  Reason for Consultation: Altered mental status, seizure activity  Requesting provider: Madelyn Flavors, MD, Attending Dr. Onalee Hua tat  Interval history 06/23/2016: EEG showed diffuse slowing without epileptiform activity.  It will be difficult to get an MRI on this patient due to encephalopathy, will order CT head. Per son, he is doing better, more alert, this morning sleepy because gave him haldol but they feel he has improved and has spoken to son today. He told son he had a headache and wanted to "get out of here"  Interval History 06/22/2016:  Sons at bedside.  Agitated and confused. At baseline he has dementia but interactive and not confused. He is talking to sons today and sometimes answers but overall he is not oriented, he is somewhat alert. Per sons, he started slurring words, very confused, acute onset symptoms. No events overnight per nursing.  He is on eliquis for recent embolic stroke and afib and family endorses compliance.   Original HPI 06/22/2016: This is an 82-yo RH man who is transferred from OSH for further evaluation of possible seizure versus stroke.   He was seen at [facility] ED today after change in mental status at home. His son reports that he was having trouble seeing what was on his plate while he was trying to eat lunch and then started staring off into space. His speech became slurred, and stopped speaking, and he developed a gaze preference. He was taken to the ED where CT was interpreted as showing an acute nonhemorrhagic infarct in the high right parietal lobe with stable left PCA territory encephalomalacia. On ED MD exam, he was noted to be confused with minor facial weakness with NIHSS of 2. Teleneurology consultation was obtained and they recommended MRI and EEG as they felt this could be either seizure or acute stroke. He was transferred to [facility] for further evaluation because [facility] has no available neurology coverage.    From  [facility] today: CBC unremarkable  CMP BUN 32, glucose 117 Troponin-I <0.01 CTH without contrast as noted above  Dr. Roxy Manns: I was called by the patient's RN that he was having stiffening of the left leg with left head turn. On my arrival to the bedside, he was having persistent tonic-clonic activity of the left arm and leg with his head turned up and to the left. No eye deviation. He actively resisted eye opening, saying "Damn it" when I checked his pupils. He had purposeful movement of the right side. He was loaded with fosphenytoin 15 PE/kg with resolution of the continuous seizure but he continued to have occasional brief focal seizures on the left. He was then loaded with Keppra 1500 mg IV.   His family at the bedside report that he had as stroke in August 2017 that resulted in left-sided weakness. He recovered well, however, and received home PT/OT with minimal residual deficits. On review of the chart, he was admitted to [facility] from 8/12-8/15/17. He presented at the time with the acute onset of left hemiparesis and R gaze. He was given IV tPA with improvement in symptoms after infusion. Workup revealed scattered infarcts in the R MCA territory, the largest of which was in the R parietal lobe. He had some asymptomatic hemorrhagic transformation of his strokes after tPA. It was felt that his strokes were the result of atrial fibrillation for which he was not anticoagulated. At the time of discharge, it was recommended that he have a repeat CT of the head in  three weeks and if no hemorrhage was seen that he be started on Eliquis for secondary stroke prevention. Discharge examination documented normal strength in all extremities with dysmetria and postural tremor in the R hand. He has an upcoming follow-up appointment with Dr. Roda ShuttersXu, vascular neurology, on 07/07/16.     PMH:  1. Atrial fibrillation 2. DM 3. HTN 4. 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 Dementia 6. Hearing loss  PSH:  No past  surgical history on file.  Family history: None reported  Social history:  He is widowed. He lives with his son. They report that he is independent with the majority his basic activities of daily living. However, they state that his short-term memory is quite poor and he requires supervision. No reported tobacco, alcohol, or illicit drug use.   Current outpatient meds: Current Meds  Medication Sig  . atenolol (TENORMIN) 25 MG tablet Take 25 mg by mouth 3 (three) times daily.   Marland Kitchen. atorvastatin (LIPITOR) 20 MG tablet Take 1 tablet (20 mg total) by mouth daily at 6 PM.  . Cyanocobalamin (B-12 PO) Take 1 tablet by mouth daily.  Marland Kitchen. ELIQUIS 5 MG TABS tablet Take 5 mg by mouth daily.  . furosemide (LASIX) 40 MG tablet Take 40 mg by mouth daily.  Marland Kitchen. MELATONIN PO Take 1 tablet by mouth daily.  . metFORMIN (GLUCOPHAGE) 500 MG tablet Take 500 mg by mouth 2 (two) times daily with a meal.  . nitrofurantoin (MACRODANTIN) 100 MG capsule Take 100 mg by mouth daily.    Current inpatient meds:  Current Facility-Administered Medications  Medication Dose Route Frequency Provider Last Rate Last Dose  .  stroke: mapping our early stages of recovery book   Does not apply Once Clydie Braunondell A Smith, MD      . 0.9 %  sodium chloride infusion   Intravenous Continuous Clydie Braunondell A Smith, MD 75 mL/hr at 06/23/16 0601    . apixaban (ELIQUIS) tablet 5 mg  5 mg Oral BID Clydie Braunondell A Smith, MD   Stopped at 06/22/16 1253  . atorvastatin (LIPITOR) tablet 20 mg  20 mg Oral q1800 Clydie Braunondell A Smith, MD   Stopped at 06/22/16 1745  . haloperidol lactate (HALDOL) injection 5 mg  5 mg Intravenous Q6H PRN Clydie Braunondell A Smith, MD   5 mg at 06/23/16 0043  . insulin aspart (novoLOG) injection 0-9 Units  0-9 Units Subcutaneous TID WC Clydie Braunondell A Smith, MD   2 Units at 06/23/16 [phone]  . levETIRAcetam (KEPPRA) 750 mg in sodium chloride 0.9 % 100 mL IVPB  750 mg Intravenous Q12H Ulice Dashavid R Smith, PA-C   750 mg at 06/23/16 [id number]  . LORazepam (ATIVAN) injection  1 mg  1 mg Intravenous Once Versie Starksimothy James Oster, MD      . LORazepam (ATIVAN) injection 1-2 mg  1-2 mg Intravenous BID PRN Clydie Braunondell A Smith, MD      . metoprolol (LOPRESSOR) injection 2.5 mg  2.5 mg Intravenous Q6H Catarina Hartshornavid Tat, MD   2.5 mg at 06/23/16 0607    Allergies: Allergies  Allergen Reactions  . Ciprofloxacin Other (See Comments)    Made pt crazy    ROS: As per HPI. A full 14-point review of systems Cannot be obtained at this time due to encephalopathy  PE:  BP (!) 158/97 (BP Location: Left Arm)   Pulse 83   Temp 97.5 F (36.4 C) (Axillary)   Resp 20   Wt 70.4 kg (155 lb 3.3 oz)   SpO2 99%  General: Elderly Caucasian man lying in bed. Opens eyes to voice. Sleeping, was given haldol. Open eyes minimally to stim. Not following commands or answering simple questions.   HEENT: Normocephalic. Neck supple without LAD. Sclerae anicteric. No conjunctival injection.   CV: Irregularly irregular, no obvious murmur. Carotid pulses full and symmetric, no bruits. Distal pulses 2+ and symmetric.  Lungs: CTAB on anterior auscultation.  Abdomen: Soft, non-distended, no rebound or guarding. Bowel sounds present x4.  Extremities: No C/C/E. Neuro:  CN: His left pupil is irregular and appears post surgical. He opens his eyes to voice. Not following commands. He is sleepy but arousable briefly.  Conjugate gaze, does not appear to have gaze preference and can cross the midline. Lateral eye movements appear intact, he will look laterally to sons on voice command bilaterally. Good eye closure strength. Left nasolabial flattening. Moving all extremities to stim mostly on the right side. Moving upper right purposely not left. No ongoing seizure activity apparent. Further neuro testing impaired by encephalopathy.    Labs:  Lab Results  Component Value Date   WBC 7.1 06/22/2016   HGB 13.8 06/22/2016   HCT 42.1 06/22/2016   PLT 155 06/22/2016   GLUCOSE 149 (H) 06/23/2016   CHOL 151 04/19/2016    TRIG 51 04/19/2016   HDL 45 04/19/2016   LDLCALC 96 04/19/2016   NA 139 06/23/2016   K 3.5 06/23/2016   CL 101 06/23/2016   CREATININE 0.94 06/23/2016   BUN 14 06/23/2016   CO2 25 06/23/2016   TSH 1.384 06/22/2016   HGBA1C 6.4 (H) 04/19/2016    Imaging:  CTH from OSH results as noted in HPI above.   Other diagnostic studies:  TTE 04/20/16: LV normal size; mild LVH; EF 55%; normal wall motion; mild aortic regurgitation; mild calcification of the mitral annulus; severe dilation of both atria; moderate tricuspid regurgitation  Carotid Dopplers 04/19/16: No hemodynamically significant stenosis.   EEG 06/22/2016: no epileptiform activity, slowed likely due to metabolic encephalopathy and/or dementia  Impression: This awake EEG is abnormal due to diffuse slowing of the waking background.  Clinical Correlation of the above findings indicates diffuse cerebral dysfunction that is non-specific in etiology and can be seen with hypoxic/ischemic injury, toxic/metabolic encephalopathies, neurodegenerative disorders, or medication effect.  The absence of epileptiform discharges does not rule out a clinical diagnosis of epilepsy.  Clinical correlation is advised.  Assessment and Plan: 82 year old PMHx afib with right MCA embolic infarcts in August 2017 started on Eliquis, dementia who presented with acute encephalopathy and seizure activity loaded with Keppra and Dilantin, repeat EEG slowed without epileptiform activity continues to be confused however slightly improved today per son as patient was following some commands earlier and speaking to family. MRI of the brain was unobtainable, ordered repeat stat CT head this morning.    1. Complex partial status epilepticus: Per report, his seizures involved the left side of his body with a left head turn and are consistent with his recent right MCA territory stroke being the focus.  He was loaded with fosphenytoin with significant reduction in his seizure  activity by overnight neurohospitalist, though due to occasional brief episodes of ongoing partial seizure he was also loaded with Keppra 1500 mg IV.Will continue Keppra. Dilantin interaction with eliquis, this was stopped. Seizure precautions. EEG 06/22/2016: slowed, no epileptiform activity  2. Cerebrovascular disease: He had a right MCA territory infarct in August 2017 that was felt to be cardioembolic in nature due to underlying atrial fibrillation.  He was recently started on Eliquis for secondary prevention and this can be continued when patient can swallow. Continue aggressive management of underlying risk factors, including diabetes, hypertension, and lipids. Recommend  MRI scan of the brain for further evaluation, though this is not likely to change overall management. MRI of the brain ordered w/ contrast however unclear if this can be completed given his agitation/confusion.  Ordered CT of the head today.   3. Dementia: His family describes what sounds like a fairly significant underlying dementia. This unfortunately places him at increased risk for delirium from all causes and could predict delayed and possibly incomplete recovery from the same. He was showing some evidence of delirium prior to tonight's seizures according to his nurse. Avoid CNS active medications, particularly benzodiazepines, opiates, and anything with strong anticholinergic properties. I will reserve benzodiazepines for refractory seizure. If agitation is an issue, recommend low doses of haloperidol or an atypical neuroleptic as needed.  4. Possible acute CVA. MRI of the brain was not obtainable. Will order CT head and then possibly Mru brain if possible however patient can be agitated making mri difficult and giving him ativan or haldol will sedate him and worsen his confusion.   5. Left lower lobe infiltrate on cxr and SOB. Defer to primary care for evaluation of other infectious/metabolic causes of his encephalopathy.    This was all discussed with the patient's family at the bedside at the time of my visit. They are in agreement with the plan as noted above, one son at bedside today. Will follow up after CT of the head.  Personally examined patient and images, and have participated in and made any corrections needed to history, physical, neuro exam,assessment and plan as stated above.  I have personally obtained the history, evaluated lab date, reviewed imaging studies and agree with radiology interpretations.    Naomie Dean, MD [facility] Neurology [phone] [facility]  A total of 45 minutes was spent face-to-face with this patient providing care. Over half this time was spent on counseling patient and family  on the seizure diagnosis and different diagnostic and therapeutic options available.

## 2016-06-23 NOTE — Progress Notes (Signed)
Physical Therapy Treatment Patient Details Name: Barry GashDallas Jones MRN: [id number] DOB: 11/20/1930 Today's Date: 06/23/2016    History of Present Illness Pt is a 82 yo male admitted after pt starting having trouble seeing lunch plate, developed slurred speech, R gaze preference and had tonic/clonic sz activity in the L sdie.  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 Pt was admitted in 8/17 with similar symptoms, was given TPA and [facility]. Pt with h/o afib, significant dementia, DMII.    PT Comments    Patient more alert this session and wanted to get out of bed. Max +2 required for mobility. Son present for session. Continue to progress as tolerated with anticipated d/c to SNF for further skilled PT services.    Follow Up Recommendations  SNF;Supervision/Assistance - 24 hour     Equipment Recommendations  None recommended by PT    Recommendations for Other Services       Precautions / Restrictions Precautions Precautions: Fall Precaution Comments: seizures Restrictions Weight Bearing Restrictions: No    Mobility  Bed Mobility Overal bed mobility: Needs Assistance;+2 for physical assistance Bed Mobility: Supine to Sit     Supine to sit: Max assist;+2 for physical assistance     General bed mobility comments: max multimodal cues for initiation and attending to task; use of rail; assist to bring bilat LE to EOB and elevate trunk into sitting  Transfers Overall transfer level: Needs assistance Equipment used: Rolling walker (2 wheeled) Transfers: Sit to/from [employer] CorporationStand;Stand Pivot Transfers Sit to Stand: Max assist;+2 physical assistance;From elevated surface Stand pivot transfers: Max assist;+2 physical assistance       General transfer comment: cues for safe hand placement and technique; assist to power up into standing and multimodal cues fo rposture and forward gaze; pt maintained flexed trunk and bilat LE; assist to advance bilat feet for pivotal step  to chair with assist at hips and to manage RW for safe descent to recliner  Ambulation/Gait             General Gait Details: unable   Stairs            Wheelchair Mobility    Modified Rankin (Stroke Patients Only)       Balance     Sitting balance-Leahy Scale: Poor Sitting balance - Comments: posterior lean and L lateral lean at times with min/mod A required      Standing balance-Leahy Scale: Zero                      Cognition Arousal/Alertness: Awake/alert Behavior During Therapy: WFL for tasks assessed/performed Overall Cognitive Status: Impaired/Different from baseline Area of Impairment: Orientation;Attention;Memory;Following commands;Safety/judgement;Awareness;Problem solving Orientation Level: Disoriented to;Time;Situation;Place Current Attention Level: Focused Memory: Decreased recall of precautions;Decreased short-term memory Following Commands: Follows one step commands inconsistently;Follows one step commands with increased time Safety/Judgement: Decreased awareness of safety;Decreased awareness of deficits Awareness: Intellectual Problem Solving: Slow processing;Decreased initiation;Requires verbal cues;Requires tactile cues General Comments: pt HOH     Exercises      General Comments General comments (skin integrity, edema, etc.): son present for session      Pertinent Vitals/Pain Pain Assessment: Faces Faces Pain Scale: Hurts little more Pain Location: neck and R knee Pain Descriptors / Indicators: Aching;Sore Pain Intervention(s): Limited activity within patient's tolerance;Monitored during session;Repositioned;Heat applied    Home Living  Prior Function            PT Goals (current goals can now be found in the care plan section) Acute Rehab PT Goals Patient Stated Goal: none stated but excited about getting out of bed  Progress towards PT goals: Progressing toward goals     Frequency    Min 4X/week      PT Plan Current plan remains appropriate    Co-evaluation             End of Session Equipment Utilized During Treatment: Gait belt Activity Tolerance: Patient tolerated treatment well Patient left: in chair;with call bell/phone within reach;with chair alarm set;with family/visitor present     Time: 1610-9604 PT Time Calculation (min) (ACUTE ONLY): 37 min  Charges:  $Therapeutic Activity: 23-37 mins                    G Codes:      Derek Mound, PTA Pager: [phone]   06/23/2016, 4:13 PM

## 2016-06-23 NOTE — Progress Notes (Signed)
Speech Language Pathology Treatment: Dysphagia  Patient Details Name: Barry Jones MRN: [id number] DOB: 04/19/1931 Today's Date: 06/23/2016 Time: [id number] SLP Time Calculation (min) (ACUTE ONLY): 26 min  Assessment / Plan / Recommendation Clinical Impression  Pt seen to assess readiness for po intake.  Pt in bed with his eyes open but with decreased ability to follow directions.  He did accept minimal po intake, ice chips administration, tsp and cup bolus nectar thick with delayed oral transiting, multiple swallows (? Oral and/or pharyngeal residuals) and throat clearing post=swallow concerning for airway penetration.  Per son, [name], pt clears his throat "all the time" for years with and without intake.  Throat clearing not observed until po administered.    Given pt with LLL pna, dysphagia symptoms and CVA recommend proceed with MBS.   Son [name] reports progressive weight loss over one year.  Pt and son educated to plan.     HPI HPI: Pt is a 82 yo male admitted after pt starting having trouble seeing lunch plate, developed slurred speech, R gaze preference and had tonic/clonic sz activity in the L sdie. 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 Pt was admitted in 8/17 with similar symptoms, was given TPA and [facility]. P resides with son and son provides for him.  Per son, pt was only mildly demented.  Pt with h/o afib, significant dementia, DMII.  CXR 10/16 with LLL infiltrate.       SLP Plan  MBS     Recommendations  Diet recommendations: NPO Medication Administration: Via alternative means                Oral Care Recommendations: Oral care QID Follow up Recommendations: Home health SLP (TBD) Plan: MBS       GO                Donavan Burnetamara Manus Weedman, MS [facility] SLP [phone]

## 2016-06-23 NOTE — Evaluation (Addendum)
Speech Language Pathology Evaluation Patient Details Name: Barry Jones MRN: [id number] DOB: [birth year] Today's Date: 06/23/2016 Time: 0922-0931 SLP Time Calculation (min) (ACUTE ONLY): 9 min  Problem List:  Patient Active Problem List   Diagnosis Date Noted  . CVA (cerebral vascular accident) (HCC) 06/21/2016  . Dehydration 06/21/2016  . Acute encephalopathy 06/21/2016  . Atrial fibrillation (HCC) 04/21/2016  . Dementia 04/21/2016  . Hyperlipidemia LDL goal <70 04/21/2016  . Diabetes mellitus type II, controlled (HCC) 04/21/2016  . Anterior cerebral circulation hemorrhagic infarction (HCC) 04/21/2016  . 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 Essential (primary) hypertension 09/10/2015  . Local edema 09/10/2015  . Supraventricular tachycardia (HCC) 09/10/2015   Past Medical History:  Past Medical History:  Diagnosis Date  . Chronic atrial fibrillation (HCC)   . Diabetes mellitus type II, controlled (HCC)   . HTN (hypertension)    Past Surgical History: No past surgical history on file. HPI:  Pt is a 82 yo male admitted after pt starting having trouble seeing lunch plate, developed slurred speech, R gaze preference and had tonic/clonic sz activity in the L side. Pt found to have right parietal CVA per CT HEAD SEPT 2017.    Pt for MRI today.  CXR showed LLL pna.  Pt was also admitted with similar symptoms 04/2016, was given TPA and returned home with son and [facility] services. Pt resides with son and son reports pt was only "milldy demented" prior to admission.  Pt with h/o afib, significant dementia, DMII.      Assessment / Plan / Recommendation Clinical Impression  Pt presents with severe cognitive linguistic deficits resulting in decreased attention, basic problem solving and receptive more than expressive language deficits.  Pt verbalized minimally during session and responded to SLP only 25% of opportunies.    His hearing loss and decreased left  attention from prior CVA also impacting pt.  He did follow one step commands approximately 20% of the time with visual cues  Perseveration noted also when attempting to name items *1/2 named correctly only.    Pt will benefit from skilled SLP to maximize functional cognitive linguistic skills to decrease caregiver burden.  Son present reports dad communicated fluently before this hospital admission.  SLP asked son to have other family member bring in batteries for his hearing aids, Thayer Ohm reports pt does not hear well even with hearing aids.      SLP Assessment  Patient needs continued Speech Lanaguage Pathology Services    Follow Up Recommendations  Home health SLP (TBD)    Frequency and Duration min 2x/week  2 weeks      SLP Evaluation Cognition  Overall Cognitive Status: Impaired/Different from baseline Arousal/Alertness: Awake/alert Orientation Level: Disoriented to place;Disoriented to time;Disoriented to situation Attention: Focused;Sustained Focused Attention: Appears intact Focused Attention Impairment: Verbal basic;Functional basic Sustained Attention: Impaired Memory: Impaired (? pt with worsening vision deficits) Awareness: Impaired Problem Solving: Impaired (pt with mitts for protection demonstrating decreased problem solving skills) Problem Solving Impairment: Functional basic Safety/Judgment: Impaired       Comprehension  Auditory Comprehension Overall Auditory Comprehension: Impaired Yes/No Questions: Impaired Basic Biographical Questions: 0-25% accurate Commands: Impaired One Step Basic Commands: 0-24% accurate Interfering Components: Attention;Hearing;Motor planning;Visual impairments;Processing speed;Working Radio broadcast assistant: Extra processing time;Increased volume;Repetition Visual Recognition/Discrimination Discrimination: Not tested Reading Comprehension Reading Status: Not tested    Expression Expression Primary Mode of Expression:  Verbal Verbal Expression Overall Verbal Expression: Impaired Initiation: Impaired Repetition: Impaired Level of Impairment: Word  level Naming: Impairment Other Naming Comments: naming items (time for watch, four for "key" due to perseveration) Interfering Components: Attention Non-Verbal Means of Communication: Not applicable Written Expression Dominant Hand: Right Written Expression: Not tested   Oral / Motor  Oral Motor/Sensory Function Overall Oral Motor/Sensory Function: Other (comment) (unable to formally test, left sided assymetry noted) Motor Speech Overall Motor Speech: Impaired Respiration: Impaired Phonation: Low vocal intensity Articulation: Within functional limitis Intelligibility: Intelligible Motor Planning: Not tested Motor Speech Errors: Not applicable   GO                    Mills KollerKimball, Zale Marcotte Ann Abrial Arrighi, MS [facility] SLP [phone]

## 2016-06-24 LAB — BASIC METABOLIC PANEL
ANION GAP: 11 (ref 5–15)
BUN: 14 mg/dL (ref 6–20)
CALCIUM: 7.9 mg/dL — AB (ref 8.9–10.3)
CO2: 24 mmol/L (ref 22–32)
Chloride: 102 mmol/L (ref 101–111)
Creatinine, Ser: 0.86 mg/dL (ref 0.61–1.24)
Glucose, Bld: 146 mg/dL — ABNORMAL HIGH (ref 65–99)
Potassium: 3.3 mmol/L — ABNORMAL LOW (ref 3.5–5.1)
Sodium: 137 mmol/L (ref 135–145)

## 2016-06-24 LAB — CBC
HCT: 41.7 % (ref 39.0–52.0)
Hemoglobin: 13.8 g/dL (ref 13.0–17.0)
MCH: 30.5 pg (ref 26.0–34.0)
MCHC: 33.1 g/dL (ref 30.0–36.0)
MCV: 92.1 fL (ref 78.0–100.0)
PLATELETS: 147 10*3/uL — AB (ref 150–400)
RBC: 4.53 MIL/uL (ref 4.22–5.81)
RDW: 14.9 % (ref 11.5–15.5)
WBC: 8.6 10*3/uL (ref 4.0–10.5)

## 2016-06-24 LAB — APTT
aPTT: 133 seconds — ABNORMAL HIGH (ref 24–36)
aPTT: 65 seconds — ABNORMAL HIGH (ref 24–36)

## 2016-06-24 LAB — GLUCOSE, CAPILLARY
GLUCOSE-CAPILLARY: 140 mg/dL — AB (ref 65–99)
GLUCOSE-CAPILLARY: 85 mg/dL (ref 65–99)
Glucose-Capillary: 107 mg/dL — ABNORMAL HIGH (ref 65–99)
Glucose-Capillary: 128 mg/dL — ABNORMAL HIGH (ref 65–99)

## 2016-06-24 LAB — MAGNESIUM: Magnesium: 2 mg/dL (ref 1.7–2.4)

## 2016-06-24 LAB — HEPARIN LEVEL (UNFRACTIONATED): HEPARIN UNFRACTIONATED: 0.87 [IU]/mL — AB (ref 0.30–0.70)

## 2016-06-24 NOTE — Progress Notes (Addendum)
Neurology Consult Note  Reason for Consultation: Altered mental status, seizure activity  Requesting provider: Madelyn Flavors, MD, Attending Dr. Onalee Hua tat  Interval history 06/24/2016: EEG showed diffuse slowing without epileptiform activity.  CT of the head showed incidental new cerebellar infarcts which did not cause symptoms. Symptoms secondary to seizure. Patient more awake on decreased dose of Keppra. Discussed with son at bedside.   Original HPI 06/22/2016: This is an 82-yo RH man who is transferred from OSH for further evaluation of possible seizure versus stroke.   He was seen at [facility] ED today after change in mental status at home. His son reports that he was having trouble seeing what was on his plate while he was trying to eat lunch and then started staring off into space. His speech became slurred, and stopped speaking, and he developed a gaze preference. He was taken to the ED where CT was interpreted as showing an acute nonhemorrhagic infarct in the high right parietal lobe with stable left PCA territory encephalomalacia. On ED MD exam, he was noted to be confused with minor facial weakness with NIHSS of 2. Teleneurology consultation was obtained and they recommended MRI and EEG as they felt this could be either seizure or acute stroke. He was transferred to [facility] for further evaluation because [facility] has no available neurology coverage.    From [facility] today: CBC unremarkable  CMP BUN 32, glucose 117 Troponin-I <0.01 CTH without contrast as noted above  Dr. Roxy Manns: I was called by the patient's RN that he was having stiffening of the left leg with left head turn. On my arrival to the bedside, he was having persistent tonic-clonic activity of the left arm and leg with his head turned up and to the left. No eye deviation. He actively resisted eye opening, saying "Damn it" when I checked his pupils. He had purposeful movement of the right side. He was loaded with  fosphenytoin 15 PE/kg with resolution of the continuous seizure but he continued to have occasional brief focal seizures on the left. He was then loaded with Keppra 1500 mg IV.   His family at the bedside report that he had as stroke in August 2017 that resulted in left-sided weakness. He recovered well, however, and received home PT/OT with minimal residual deficits. On review of the chart, he was admitted to [facility] from 8/12-8/15/17. He presented at the time with the acute onset of left hemiparesis and R gaze. He was given IV tPA with improvement in symptoms after infusion. Workup revealed scattered infarcts in the R MCA territory, the largest of which was in the R parietal lobe. He had some asymptomatic hemorrhagic transformation of his strokes after tPA. It was felt that his strokes were the result of atrial fibrillation for which he was not anticoagulated. At the time of discharge, it was recommended that he have a repeat CT of the head in three weeks and if no hemorrhage was seen that he be started on Eliquis for secondary stroke prevention. Discharge examination documented normal strength in all extremities with dysmetria and postural tremor in the R hand. He has an upcoming follow-up appointment with Dr. Roda Shutters, vascular neurology, on 07/07/16.     PMH:  1. Atrial fibrillation 2. DM 3. HTN 4. 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 Dementia 6. Hearing loss  PSH:  No past surgical history on file.  Family history: None reported  Social history:  He is widowed. He lives with his son. They report  that he is independent with the majority his basic activities of daily living. However, they state that his short-term memory is quite poor and he requires supervision. No reported tobacco, alcohol, or illicit drug use.   Current outpatient meds: Current Meds  Medication Sig  . atenolol (TENORMIN) 25 MG tablet Take 25 mg by mouth 3 (three) times daily.   Marland Kitchen atorvastatin (LIPITOR) 20 MG tablet Take 1  tablet (20 mg total) by mouth daily at 6 PM.  . Cyanocobalamin (B-12 PO) Take 1 tablet by mouth daily.  Marland Kitchen ELIQUIS 5 MG TABS tablet Take 5 mg by mouth daily.  . furosemide (LASIX) 40 MG tablet Take 40 mg by mouth daily.  Marland Kitchen MELATONIN PO Take 1 tablet by mouth daily.  . metFORMIN (GLUCOPHAGE) 500 MG tablet Take 500 mg by mouth 2 (two) times daily with a meal.  . nitrofurantoin (MACRODANTIN) 100 MG capsule Take 100 mg by mouth daily.    Current inpatient meds:  Current Facility-Administered Medications  Medication Dose Route Frequency Provider Last Rate Last Dose  .  stroke: mapping our early stages of recovery book   Does not apply Once Clydie Braun, MD      . 0.9 %  sodium chloride infusion   Intravenous Continuous Clydie Braun, MD 75 mL/hr at 06/23/16 2100    . atorvastatin (LIPITOR) tablet 20 mg  20 mg Oral q1800 Clydie Braun, MD   Stopped at 06/22/16 1745  . chlorhexidine (PERIDEX) 0.12 % solution 15 mL  15 mL Mouth Rinse BID Catarina Hartshorn, MD   15 mL at 06/24/16 1038  . haloperidol lactate (HALDOL) injection 5 mg  5 mg Intravenous Q6H PRN Clydie Braun, MD   5 mg at 06/23/16 0043  . heparin ADULT infusion 100 units/mL (25000 units/287mL sodium chloride 0.45%)  850 Units/hr Intravenous Continuous Almon Hercules, RPH 8.5 mL/hr at 06/24/16 1038 850 Units/hr at 06/24/16 1038  . insulin aspart (novoLOG) injection 0-9 Units  0-9 Units Subcutaneous TID WC Clydie Braun, MD   1 Units at 06/24/16 0825  . levETIRAcetam (KEPPRA) 500 mg in sodium chloride 0.9 % 100 mL IVPB  500 mg Intravenous Q12H Anson Fret, MD   500 mg at 06/24/16 0826  . LORazepam (ATIVAN) injection 1 mg  1 mg Intravenous Once Versie Starks, MD      . LORazepam (ATIVAN) injection 1-2 mg  1-2 mg Intravenous BID PRN Clydie Braun, MD      . MEDLINE mouth rinse  15 mL Mouth Rinse q12n4p Catarina Hartshorn, MD   15 mL at 06/24/16 1243  . metoprolol (LOPRESSOR) injection 2.5 mg  2.5 mg Intravenous Q6H Catarina Hartshorn, MD   2.5 mg at  06/24/16 1242    Allergies: Allergies  Allergen Reactions  . Ciprofloxacin Other (See Comments)    Made pt crazy    ROS: As per HPI. A full 14-point review of systems Cannot be obtained at this time due to encephalopathy  PE:  BP 128/78 (BP Location: Left Arm)   Pulse 78   Temp 98.3 F (36.8 C) (Oral)   Resp 20   Wt 70.4 kg (155 lb 3.3 oz)   SpO2 99%   General: Elderly Caucasian man lying in bed. More alert today, oriented to self.   HEENT: Normocephalic. Neck supple without LAD. Sclerae anicteric. No conjunctival injection.   CV: Irregularly irregular, no obvious murmur. Carotid pulses full and symmetric, no bruits. Distal pulses 2+ and symmetric.  Lungs: CTAB on anterior auscultation.  Abdomen: Soft, non-distended, no rebound or guarding. Bowel sounds present x4.  Extremities: No C/C/E. Neuro:  CN: His left pupil is irregular and appears post surgical. He is alert today and shakes my hand, oriented to self. Good eye closure strength. Left nasolabial flattening. Moving all extremities to stim mostly on the right side. Shakes my hand with his right hand. No ongoing seizure activity apparent.   Labs:  Lab Results  Component Value Date   WBC 8.6 06/24/2016   HGB 13.8 06/24/2016   HCT 41.7 06/24/2016   PLT 147 (L) 06/24/2016   GLUCOSE 146 (H) 06/24/2016   CHOL 151 04/19/2016   TRIG 51 04/19/2016   HDL 45 04/19/2016   LDLCALC 96 04/19/2016   NA 137 06/24/2016   K 3.3 (L) 06/24/2016   CL 102 06/24/2016   CREATININE 0.86 06/24/2016   BUN 14 06/24/2016   CO2 24 06/24/2016   TSH 1.384 06/22/2016   HGBA1C 6.4 (H) 04/19/2016    Imaging:  CTH from OSH results as noted in HPI above.   Other diagnostic studies:  TTE 04/20/16: LV normal size; mild LVH; EF 55%; normal wall motion; mild aortic regurgitation; mild calcification of the mitral annulus; severe dilation of both atria; moderate tricuspid regurgitation  Carotid Dopplers 04/19/16: No hemodynamically significant  stenosis.   EEG 06/22/2016: no epileptiform activity, slowed likely due to metabolic encephalopathy and/or dementia  Impression: This awake EEG is abnormal due to diffuse slowing of the waking background.  CT of the head 06/23/2016:  1. Two small right cerebellar infarcts have occurred since comparison 05/12/2016. 2. Expected evolution of recent right parietal infarct. Superimposed hemorrhage has resolved. 3. Remote left occipital infarct. 4. Atrophy and ventriculomegaly.  Clinical Correlation of the above findings indicates diffuse cerebral dysfunction that is non-specific in etiology and can be seen with hypoxic/ischemic injury, toxic/metabolic encephalopathies, neurodegenerative disorders, or medication effect.  The absence of epileptiform discharges does not rule out a clinical diagnosis of epilepsy.  Clinical correlation is advised.  Assessment and Plan: 82 year old PMHx afib with right MCA embolic infarcts in August 2017 started on Eliquis, dementia who presented with acute encephalopathy and seizure activity loaded with Keppra and Dilantin, repeat EEG slowed without epileptiform activity continues to be confused however he is more alert today.  1. Complex partial status epilepticus: More alert with decrease in his keppra dose. Continue Keppra  bid  2. Cerebrovascular disease: He had a right MCA territory infarct in August 2017 that was felt to be cardioembolic in nature due to underlying atrial fibrillation. 2 new incidental cerebellar infarcts. He was recently started on Eliquis for secondary prevention and this can be continued when patient can swallow. On heparin.  Neuro will sign off, please contact us for any other questions.  This was all discussed with the patient's family at the bedside at the time of my visit. They are in agreement with the plan as noted above, one son at bedside today. Will follow up after CT of the head.  Personally examined patient and images, and  have participated in and made any corrections needed to history, physical, neuro exam,assessment and plan as stated above.  I have personally obtained the history, evaluated lab date, reviewed imaging studies and agree with radiology interpretations.    Naomie Dean, MD [facility] Neurology [phone] [facility]  A total of 35 minutes was spent face-to-face with this patient providing care. Over half this time was spent on counseling  patient and family  on the seizure and encephalopathy diagnosis and different diagnostic and therapeutic options available.

## 2016-06-24 NOTE — Progress Notes (Signed)
No charge note  Palliative consult request received.  Patient seen briefly and discussed with son [name] present in the room.  Patient's HCPOA daughter is arriving tomorrow,she will be in his room from 7 am to 4 pm tomorrow.   Full note and recommendations to follow.  Thank you for the consult.   Rosalin HawkingZeba Jaylenn Baiza, MD [phone]

## 2016-06-24 NOTE — Progress Notes (Signed)
ANTICOAGULATION CONSULT NOTE Pharmacy Consult for heparin Indication: atrial fibrillation  Allergies  Allergen Reactions  . Ciprofloxacin Other (See Comments)    Made pt crazy    Patient Measurements: Weight: 155 lb 3.3 oz (70.4 kg) Heparin Dosing Weight: 70kg  Vital Signs: Temp: 98.3 F (36.8 C) (10/18 1408) Temp Source: Oral (10/18 1408) BP: 128/78 (10/18 1408) Pulse Rate: 78 (10/18 1408)  Labs:  Recent Labs  06/22/16 0345 06/23/16 0415  06/23/16 1615 06/23/16 2324 06/24/16 0440 06/24/16 0903 06/24/16 2034  HGB 13.8  --   --   --   --  13.8  --   --   HCT 42.1  --   --   --   --  41.7  --   --   PLT 155  --   --   --   --  147*  --   --   APTT  --   --   < > 35 81*  --  133* 65*  HEPARINUNFRC  --   --   --  0.64 0.88*  --  0.87*  --   CREATININE 0.98 0.94  --   --   --  0.86  --   --   < > = values in this interval not displayed.  CrCl cannot be calculated (Unknown ideal weight.).   Medical History: Past Medical History:  Diagnosis Date  . Chronic atrial fibrillation (HCC)   . Diabetes mellitus type II, controlled (HCC)   . HTN (hypertension)     Assessment: [age] yom with dementia, on apixaban PTA for afib. Patient now unable to take PO - Pharmacy consulted to dose heparin. Patient had not had any doses of apixaban inpatient to this point, last dose was 10/15 per med rec. Hg stable, plt down to 147.  Heparin level this AM (0.85), aPTT also supratherapeutic at 133 on 1050 units/h. No issues with IV line or bleeding per RN. Heparin drip was decreased 850 uts/hr and aPTT had large drop 65sec, just below goal.   Goal of Therapy:  Heparin level 0.3-0.7 units/ml  APTT 66-102 Monitor platelets by anticoagulation protocol: Yes   Plan:  Increase  heparin to 950 units/h Daily heparin level/aPTT/CBC Monitor for s/sx bleeding F/u anticoagulation plan   Leota SauersLisa Seng Larch Pharm.D. CPP, BCPS Clinical Pharmacist [phone] 06/24/2016 9:21 PM

## 2016-06-24 NOTE — Progress Notes (Signed)
ANTICOAGULATION CONSULT NOTE Pharmacy Consult for heparin Indication: atrial fibrillation  Allergies  Allergen Reactions  . Ciprofloxacin Other (See Comments)    Made pt crazy    Patient Measurements: Weight: 155 lb 3.3 oz (70.4 kg) Heparin Dosing Weight: 70kg  Vital Signs: Temp: 97.7 F (36.5 C) (10/17 2124) Temp Source: Oral (10/17 2124) BP: 165/87 (10/17 2124) Pulse Rate: 47 (10/17 2124)  Labs:  Recent Labs  06/22/16 0345 06/23/16 0415 06/23/16 1615 06/23/16 2324  HGB 13.8  --   --   --   HCT 42.1  --   --   --   PLT 155  --   --   --   APTT  --   --  35 81*  HEPARINUNFRC  --   --  0.64 0.88*  CREATININE 0.98 0.94  --   --     CrCl cannot be calculated (Unknown ideal weight.).  Assessment: 82 y.o. male with h/o Afib, Eliquis on hold, for heparin  Goal of Therapy:  Heparin level 0.3-0.7 units/ml Monitor platelets by anticoagulation protocol: Yes   Plan:  Continue Heparin at current rate Follow-up am labs.   Geannie RisenGreg Edd Reppert, PharmD, BCPS  06/24/2016 12:03 AM

## 2016-06-24 NOTE — Progress Notes (Signed)
Speech Language Pathology Treatment: Dysphagia  Patient Details Name: Barry Jones MRN: [id number] DOB: 03/11/1931 Today's Date: 06/24/2016 Time: [id number] SLP Time Calculation (min) (ACUTE ONLY): 17 min  Assessment / Plan / Recommendation Clinical Impression  Pt is currently lethargic/sleeping, not able to participate in SLP, therefore session focused on educating son in pt's presence.  Chris re-informed to findings of MBS and SLP concern for level of severity of dysphagia prohibiting consumption with adequate nutrition and airway protection during this hospital coarse.    Given pt has LLL infiltrate and pt chronic throat clearing, concern for premorbid dysphagia present. Son reports pt with progressive weight loss and states primary MD indicates weight loss is due to Metformin.    Provided son with written information regarding feeding tubes in patients with dysphagia and dementia as anticipate decision may be forthcoming.  Per son, pt reports deference to MD re: decision for feeding tube.    Also introduced concept of comfort feeding with acceptance of aspiration risk may be an option to consider to maximize QOL.   Pt will need a repeat MBS prior to consideration for po intake due to his severe sensorimotor deficits.  Recommend tsps thin water only by mouth when fully alert after oral care pending decisions.    HPI HPI: Pt is a 82 yo male admitted after pt starting having trouble seeing lunch plate, developed slurred speech, R gaze preference and had tonic/clonic sz activity in the L side. Pt found to have right parietal CVA per CT HEAD SEPT 2017.    Pt for MRI today.  CXR showed LLL pna.  Pt was also admitted with similar symptoms 04/2016, was given TPA and [facility] Pt resides with son and son reports pt was only "milldy demented" prior to admission.  Pt with h/o afib, significant dementia, DMII.         SLP Plan  Continue with current plan of care      Recommendations  Diet recommendations: NPO (tsps water after oral care only and when fully alert) Medication Administration: Via alternative means                Oral Care Recommendations: Oral care QID Follow up Recommendations:  (tbd) Plan: Continue with current plan of care       GO                Donavan Burnetamara Candra Wegner, MS [facility] SLP [phone]

## 2016-06-24 NOTE — Progress Notes (Signed)
ANTICOAGULATION CONSULT NOTE Pharmacy Consult for heparin Indication: atrial fibrillation  Allergies  Allergen Reactions  . Ciprofloxacin Other (See Comments)    Made pt crazy    Patient Measurements: Weight: 155 lb 3.3 oz (70.4 kg) Heparin Dosing Weight: 70kg  Vital Signs: Temp: 98.1 F (36.7 C) (10/18 0603) Temp Source: Oral (10/18 0603) BP: 155/82 (10/18 0603) Pulse Rate: 63 (10/18 0603)  Labs:  Recent Labs  06/22/16 0345 06/23/16 0415 06/23/16 1615 06/23/16 2324 06/24/16 0440 06/24/16 0903  HGB 13.8  --   --   --  13.8  --   HCT 42.1  --   --   --  41.7  --   PLT 155  --   --   --  147*  --   APTT  --   --  35 81*  --  133*  HEPARINUNFRC  --   --  0.64 0.88*  --  0.87*  CREATININE 0.98 0.94  --   --  0.86  --     CrCl cannot be calculated (Unknown ideal weight.).   Medical History: Past Medical History:  Diagnosis Date  . Chronic atrial fibrillation (HCC)   . Diabetes mellitus type II, controlled (HCC)   . HTN (hypertension)     Assessment: [age] yom with dementia, on apixaban PTA for afib. Patient now unable to take PO - Pharmacy consulted to dose heparin. Patient had not had any doses of apixaban inpatient to this point, last dose was 10/15 per med rec. Hg stable, plt down to 147.  Heparin level this AM (0.85), aPTT also supratherapeutic at 133 on 1050 units/h. No issues with IV line or bleeding per RN.  Goal of Therapy:  Heparin level 0.3-0.7 units/ml  APTT 66-102 Monitor platelets by anticoagulation protocol: Yes   Plan:  Decrease heparin to 850 units/h 8h aPTT Daily heparin level/aPTT/CBC Monitor for s/sx bleeding F/u anticoagulation plan   Babs BertinHaley Jamien Casanova, PharmD, BCPS Clinical Pharmacist 06/24/2016 10:02 AM

## 2016-06-24 NOTE — Progress Notes (Signed)
PROGRESS NOTE    Barry Jones  ZOX:[id number] DOB: Dec 23, 1930 DOA: 06/21/2016 PCP: Paulina Fusi, MD   Brief Narrative:  82 y.o.malewith medical history significant of CVA, HTN, DMII, afib on Eliquis, and dementia; who presents with confusion and gaze preference. His son reports that he was having trouble seeing what was on his plate while he was trying to eat lunch and then started staring off into space. His speech became slurred, and stopped speaking, and he developed a gaze preference. He was taken to the ED where CT was interpreted as showing an acute nonhemorrhagic infarct in the high right parietal lobe with stable left PCA territory encephalomalacia. The pt was transferred to [facility] where neurology was consulted. The patient was given fosphenytoin with resolution of the continuous seizure but he continued to have occasional brief focal seizures on the left. He was then loaded with Keppra 1500 mg IV.   He had as stroke in August 2017 that resulted in left-sided weakness. He recovered well, however, and received home PT/OT with minimal residual deficits. On review of the chart, he was admitted to [facility] from 8/12-8/15/17. He presented at the time with the acute onset of left hemiparesis and R gaze. He was given IV tPA with improvement in symptoms after infusion. Workup revealed scattered infarcts in the R MCA territory, the largest of which was in the R parietal lobe. He had some asymptomatic hemorrhagic transformation of his strokes after tPA. It was felt that his strokes were the result of atrial fibrillation for which he was not anticoagulated. At the time of discharge, it was recommended that he have a repeat CT of the head in three weeks and if no hemorrhage was seen that he be started on Eliquis for secondary stroke prevention.   Assessment & Plan:   Principal Problem:   Acute encephalopathy Active Problems:   Essential (primary) hypertension   Atrial fibrillation (HCC)  Dementia   Hyperlipidemia LDL goal <70   Diabetes mellitus type II, controlled (HCC)   CVA (cerebral vascular accident) (HCC)   Dehydration   Dysphagia   Dysphagia, oropharyngeal phase   Acute Encephalopathy -multifactorial including seizures and anticholinergic medications (given benadryl), ??new stroke -04/20/2016 serum B12 672 -Urinalysis negative for pyuria -04/20/2016 TSH 0.996 -Check ammonia--11 - patient awake but not conversive and did not follow commands - remains NPO- serious concern for dysphagia  Complex partial status epilepticus -manifested bytonic-clonic activity of the left arm and leg with his head turned up and to the leftwitness by neurology--consistent with his recent right MCA territory stroke being the focus.  -AEDs per neurology--d/c dilantin due to interaction with apixaban, started keppra -EEG diffuse slowing  Cerebrovascular disease--concerned about new stroke  -had a right MCA territory infarct in August 2017 that was felt to be cardioembolic in nature due to underlying atrial fibrillation -recently started on Eliquis for secondary prevention and this can be continued per neurology  -repeat MRI brain per neurology -06/23/16 CT brain--2 small Right cerebellar infarcts since 05/12/16  Dysphagia -failed MBS -remain npo -d/c apixaban -start IV heparin without bolus  Goals of Care -son/family open to GOC discussion -consult palliative medicine -remain FULL CODE pending palliative medicine discussion - palliative evaluated patient today - POA to be at hospital in am  LLL Infiltrate -will NOT start abx -no fever, no leukocytosis, no hypoxia -monitor clinically  Dementia:  -His family describes what sounds like a fairly significant underlying dementia.  -places him at increased risk for delirium from all causes  and could predict delayed and possibly incomplete recovery from the same. -Avoid CNS active medications, particularly  benzodiazepines, opiates, and anything with strong anticholinergic properties -haldol prn - patient unable to participate in interview or exam today  Chronic Atrial Fibrillation -rate controlled -continue apixaban when able to tolerate PO -change atenolol to IV lopressor -CHADSVASc = 7  Diabetes Mellitus type 2 -04/19/2016 and hemoglobin A1c 6.4 -NovoLog sliding scale -Allow for liberalglycemic control  Hyperlipidemia -Continue statin when able to take po  Dehydration -continue IVF -am BMP  HTN -change atenolol to IV lopressor  Hypomagnesemia  -replete -Mg this am at 2.0   Disposition Plan: SNF vs residential hospice Family Communication: Son updatedat bedside Code Status: FULL--confirmed with son at bedside DVT Prophylaxis: apixaban-->IV heparin    Consultants:   Neurology  Palliative Medicine  Procedures:   none  Antimicrobials:   none    Subjective: Patient seen and evaluated.  Nonverbal during exam.  Son is bedside- he is not POA.  States his sister who is partial POA is coming to hospital tomorrow.  Objective: Vitals:   06/24/16 0127 06/24/16 0603 06/24/16 1035 06/24/16 1408  BP: (!) 150/90 (!) 155/82 136/88 128/78  Pulse: 91 63 82 78  Resp: 20 20 20 20   Temp: 98.2 F (36.8 C) 98.1 F (36.7 C) 98.7 F (37.1 C) 98.3 F (36.8 C)  TempSrc: Oral Oral Oral Oral  SpO2: 98% 99% 99% 99%  Weight:        Intake/Output Summary (Last 24 hours) at 06/24/16 1630 Last data filed at 06/24/16 0659  Gross per 24 hour  Intake           1960.8 ml  Output              300 ml  Net           1660.8 ml   Filed Weights   06/21/16 2100 06/22/16 0012  Weight: 70.2 kg (154 lb 12.2 oz) 70.4 kg (155 lb 3.3 oz)    Examination:  General exam: Appears calm and comfortable  Respiratory system: Clear to auscultation. Respiratory effort normal. Cardiovascular system: S1 & S2 heard, irregularly irregular rhythm. No JVD, murmurs, rubs, gallops or  clicks. No pedal edema. Gastrointestinal system: Abdomen is nondistended, soft and nontender. No organomegaly or masses felt. Normal bowel sounds heard. Central nervous system: Alert and oriented. No focal neurological deficits. Extremities: unable to assess. Skin: No rashes, lesions or ulcers Psychiatry: unable to assess as patient not participatory.     Data Reviewed: I have personally reviewed following labs and imaging studies  CBC:  Recent Labs Lab 06/22/16 0345 06/24/16 0440  WBC 7.1 8.6  NEUTROABS 4.8  --   HGB 13.8 13.8  HCT 42.1 41.7  MCV 93.3 92.1  PLT 155 147*   Basic Metabolic Panel:  Recent Labs Lab 06/22/16 0345 06/23/16 0415 06/24/16 0440  NA 142 139 137  K 3.5 3.5 3.3*  CL 101 101 102  CO2 30 25 24   GLUCOSE 121* 149* 146*  BUN 24* 14 14  CREATININE 0.98 0.94 0.86  CALCIUM 8.6* 8.2* 7.9*  MG  --  1.2* 2.0   GFR: CrCl cannot be calculated (Unknown ideal weight.). Liver Function Tests:  Recent Labs Lab 06/22/16 0345  ALBUMIN 3.5   No results for input(s): LIPASE, AMYLASE in the last 168 hours.  Recent Labs Lab 06/22/16 1707  AMMONIA 11   Coagulation Profile: No results for input(s): INR, PROTIME in the last 168 hours.  Cardiac Enzymes: No results for input(s): CKTOTAL, CKMB, CKMBINDEX, TROPONINI in the last 168 hours. BNP (last 3 results) No results for input(s): PROBNP in the last 8760 hours. HbA1C: No results for input(s): HGBA1C in the last 72 hours. CBG:  Recent Labs Lab 06/23/16 1139 06/23/16 1624 06/23/16 2106 06/24/16 0635 06/24/16 1134  GLUCAP 132* 124* 167* 140* 107*   Lipid Profile: No results for input(s): CHOL, HDL, LDLCALC, TRIG, CHOLHDL, LDLDIRECT in the last 72 hours. Thyroid Function Tests:  Recent Labs  06/22/16 1707  TSH 1.384   Anemia Panel: No results for input(s): VITAMINB12, FOLATE, FERRITIN, TIBC, IRON, RETICCTPCT in the last 72 hours. Sepsis Labs: No results for input(s): PROCALCITON,  LATICACIDVEN in the last 168 hours.  No results found for this or any previous visit (from the past 240 hour(s)).       Radiology Studies: Ct Head Wo Contrast  Result Date: 06/23/2016 CLINICAL DATA:  Acute encephalopathy. EXAM: CT HEAD WITHOUT CONTRAST TECHNIQUE: Contiguous axial images were obtained from the base of the skull through the vertex without intravenous contrast. COMPARISON:  05/12/2016 FINDINGS: Brain: 2 small areas of low-density in the peripheral right cerebellum are new from 05/12/2016 head CT and brain MRI 04/19/2016. No interval supratentorial infarction. Expected evolution of right parietal infarct with resolved high-density and decreasing mass effect. Remote left inferior occipital infarct with dense gliosis. Atrophy with ventriculomegaly. Mild small vessel ischemic change in the cerebral white matter. Remote lacunar infarct in the left thalamus. Vascular: Atherosclerotic calcification. Skull: Negative Sinuses/Orbits: Bilateral cataract resection.  No acute finding. IMPRESSION: 1. Two small right cerebellar infarcts have occurred since comparison 05/12/2016. 2. Expected evolution of recent right parietal infarct. Superimposed hemorrhage has resolved. 3. Remote left occipital infarct. 4. Atrophy and ventriculomegaly. Electronically Signed   By: Marnee Spring M.D.   On: 06/23/2016 12:27   Dg Swallowing Func-speech Pathology  Result Date: 06/23/2016 Objective Swallowing Evaluation: Type of Study: MBS-Modified Barium Swallow Study Patient Details Name: Mansour Balboa MRN: [id number] Date of Birth: 01/02/1931 Today's Date: 06/23/2016 Time: SLP Start Time (ACUTE ONLY): 0955-SLP Stop Time (ACUTE ONLY): 1025 SLP Time Calculation (min) (ACUTE ONLY): 30 min Past Medical History: Past Medical History: Diagnosis Date . Chronic atrial fibrillation (HCC)  . Diabetes mellitus type II, controlled (HCC)  . HTN (hypertension)  Past Surgical History: No past surgical history on file. HPI: Pt is a 82  yo male admitted after pt starting having trouble seeing lunch plate, developed slurred speech, R gaze preference and had tonic/clonic sz activity in the L side. Pt found to have right parietal CVA per CT HEAD SEPT 2017.    Pt for MRI today.  CXR showed LLL pna.  Pt was also admitted with similar symptoms 04/2016, was given TPA and returned home with son and [facility] services. Pt resides with son and son reports pt was only "milldy demented" prior to admission.  Pt with h/o afib, significant dementia, DMII.    Subjective: pt awake in chair Assessment / Plan / Recommendation CHL IP CLINICAL IMPRESSIONS 06/23/2016 Therapy Diagnosis Moderate oral phase dysphagia;Severe pharyngeal phase dysphagia Clinical Impression Moderate oral and severe pharyngeal dysphagia with sensorimotor deficits.  Oral weakness/discoordination results in delayed oral transiting, decreased oral bolus cohesion with premature spillage into pharynx.  Pharyngeal swallow was delayed and weak resulting in severe pharyngeal residuals WITHOUT pt awareness.  SlP used dry spoon stimulation to tongue to elicit swallow - but this did not clear residuals.  Mild aspiration observed with nectar, thin and secretions  mixed with barium without pt attempt to cough to clear.  Postures attempted but unable to be performed due to pt's difficulty following directions.   Pt is at high aspiration risk with po; pending family decisions, would recommend npo except single tsps of water for comfort/oral hygeine.  Son Thayer Ohm present and SLP educated son/pt to findings/recommendations.  Advised son to cue pt to cough and swallow if he is throat clearing as this is indicative of aspiration.  Will follow for po readiness.   Impact on safety and function Severe aspiration risk   CHL IP TREATMENT RECOMMENDATION 06/23/2016 Treatment Recommendations Therapy as outlined in treatment plan below   Prognosis 06/23/2016 Prognosis for Safe Diet Advancement Fair Barriers to Reach Goals Severity  of deficits;Cognitive deficits;Other (Comment) Barriers/Prognosis Comment -- CHL IP DIET RECOMMENDATION 06/23/2016 SLP Diet Recommendations NPO Liquid Administration via -- Medication Administration Via alternative means Compensations -- Postural Changes --   CHL IP OTHER RECOMMENDATIONS 06/23/2016 Recommended Consults -- Oral Care Recommendations Oral care QID Other Recommendations --   CHL IP FOLLOW UP RECOMMENDATIONS 06/23/2016 Follow up Recommendations Home health SLP   CHL IP FREQUENCY AND DURATION 06/23/2016 Speech Therapy Frequency (ACUTE ONLY) min 2x/week Treatment Duration 2 weeks      CHL IP ORAL PHASE 06/23/2016 Oral Phase Impaired Oral - Pudding Teaspoon -- Oral - Pudding Cup -- Oral - Honey Teaspoon -- Oral - Honey Cup -- Oral - Nectar Teaspoon Weak lingual manipulation;Reduced posterior propulsion;Delayed oral transit;Decreased bolus cohesion;Premature spillage Oral - Nectar Cup -- Oral - Nectar Straw -- Oral - Thin Teaspoon Reduced posterior propulsion;Weak lingual manipulation;Delayed oral transit;Premature spillage Oral - Thin Cup Weak lingual manipulation;Reduced posterior propulsion;Delayed oral transit;Premature spillage Oral - Thin Straw Weak lingual manipulation;Premature spillage;Reduced posterior propulsion;Delayed oral transit Oral - Puree Weak lingual manipulation;Delayed oral transit;Reduced posterior propulsion;Premature spillage Oral - Mech Soft -- Oral - Regular -- Oral - Multi-Consistency -- Oral - Pill -- Oral Phase - Comment --  CHL IP PHARYNGEAL PHASE 06/23/2016 Pharyngeal Phase Impaired Pharyngeal- Pudding Teaspoon -- Pharyngeal -- Pharyngeal- Pudding Cup -- Pharyngeal -- Pharyngeal- Honey Teaspoon -- Pharyngeal -- Pharyngeal- Honey Cup -- Pharyngeal -- Pharyngeal- Nectar Teaspoon Delayed swallow initiation-vallecula;Reduced pharyngeal peristalsis;Reduced epiglottic inversion;Reduced anterior laryngeal mobility;Reduced laryngeal elevation;Reduced airway/laryngeal closure;Pharyngeal  residue - valleculae;Pharyngeal residue - pyriform;Trace aspiration;Penetration/Apiration after swallow Pharyngeal Material enters airway, passes BELOW cords without attempt by patient to eject out (silent aspiration) Pharyngeal- Nectar Cup -- Pharyngeal -- Pharyngeal- Nectar Straw -- Pharyngeal -- Pharyngeal- Thin Teaspoon Delayed swallow initiation-vallecula;Reduced airway/laryngeal closure;Reduced tongue base retraction;Reduced laryngeal elevation;Reduced anterior laryngeal mobility;Reduced epiglottic inversion;Reduced pharyngeal peristalsis;Pharyngeal residue - valleculae;Pharyngeal residue - pyriform Pharyngeal -- Pharyngeal- Thin Cup Delayed swallow initiation-vallecula;Reduced airway/laryngeal closure;Reduced pharyngeal peristalsis;Reduced epiglottic inversion;Reduced anterior laryngeal mobility;Reduced laryngeal elevation;Reduced tongue base retraction;Pharyngeal residue - valleculae;Pharyngeal residue - pyriform;Penetration/Aspiration during swallow;Penetration/Apiration after swallow Pharyngeal -- Pharyngeal- Thin Straw Delayed swallow initiation-vallecula;Reduced airway/laryngeal closure;Reduced tongue base retraction;Reduced epiglottic inversion;Reduced pharyngeal peristalsis;Reduced anterior laryngeal mobility;Reduced laryngeal elevation;Penetration/Aspiration during swallow;Penetration/Apiration after swallow;Trace aspiration Pharyngeal Material enters airway, passes BELOW cords without attempt by patient to eject out (silent aspiration) Pharyngeal- Puree Reduced pharyngeal peristalsis;Reduced epiglottic inversion;Reduced anterior laryngeal mobility;Reduced laryngeal elevation;Reduced airway/laryngeal closure;Reduced tongue base retraction;Pharyngeal residue - valleculae Pharyngeal -- Pharyngeal- Mechanical Soft -- Pharyngeal -- Pharyngeal- Regular -- Pharyngeal -- Pharyngeal- Multi-consistency -- Pharyngeal -- Pharyngeal- Pill -- Pharyngeal -- Pharyngeal Comment various postures attempted including  chin tuck and head turn left *to weak side* without pt ability to perform adequately; pt did cough x1 on command and reflexively swallowed 1/4 opportunities  CHL IP CERVICAL ESOPHAGEAL PHASE 06/23/2016 Cervical Esophageal Phase Impaired Pudding Teaspoon -- Pudding Cup -- Honey Teaspoon -- Honey Cup -- Nectar Teaspoon -- Nectar Cup -- Nectar Straw -- Thin Teaspoon -- Thin Cup -- Thin Straw -- Puree -- Mechanical Soft -- Regular -- Multi-consistency -- Pill -- Cervical Esophageal Comment secretion retention noted at pyriform sinus that mixed with barium without pt awareness, upon esophageal sweep at end of study, pt appeared with mild barium retention (also mixed with secretions) at distal region without awareness, SLP questions if this could contribute to pt's chronic "throat clearing" reported by his son No flowsheet data found. Mickie Bail [place], [place] [facility] SLP [phone]                   Scheduled Meds: .  stroke: mapping our early stages of recovery book   Does not apply Once  . atorvastatin  20 mg Oral q1800  . chlorhexidine  15 mL Mouth Rinse BID  . insulin aspart  0-9 Units Subcutaneous TID WC  . levETIRAcetam  500 mg Intravenous Q12H  . LORazepam  1 mg Intravenous Once  . mouth rinse  15 mL Mouth Rinse q12n4p  . metoprolol  2.5 mg Intravenous Q6H   Continuous Infusions: . sodium chloride 75 mL/hr at 06/23/16 2100  . heparin 850 Units/hr (06/24/16 1038)     LOS: 3 days    Time spent: 35 minutes    Bennett Scrape, MD [facility] Pager [phone]  If 7PM-7AM, please contact night-coverage [email or web address] Password [email or web address] 06/24/2016, 4:30 PM

## 2016-06-25 ENCOUNTER — Encounter (HOSPITAL_COMMUNITY): Payer: Self-pay | Admitting: General Practice

## 2016-06-25 LAB — CBC
HEMATOCRIT: 39 % (ref 39.0–52.0)
Hemoglobin: 12.8 g/dL — ABNORMAL LOW (ref 13.0–17.0)
MCH: 30.3 pg (ref 26.0–34.0)
MCHC: 32.8 g/dL (ref 30.0–36.0)
MCV: 92.4 fL (ref 78.0–100.0)
PLATELETS: 152 10*3/uL (ref 150–400)
RBC: 4.22 MIL/uL (ref 4.22–5.81)
RDW: 15.2 % (ref 11.5–15.5)
WBC: 8.7 10*3/uL (ref 4.0–10.5)

## 2016-06-25 LAB — GLUCOSE, CAPILLARY
GLUCOSE-CAPILLARY: 70 mg/dL (ref 65–99)
Glucose-Capillary: 152 mg/dL — ABNORMAL HIGH (ref 65–99)
Glucose-Capillary: 87 mg/dL (ref 65–99)
Glucose-Capillary: 116 mg/dL — ABNORMAL HIGH (ref 65–99)

## 2016-06-25 LAB — HEPARIN LEVEL (UNFRACTIONATED): Heparin Unfractionated: 0.58 IU/mL (ref 0.30–0.70)

## 2016-06-25 LAB — APTT: aPTT: 79 seconds — ABNORMAL HIGH (ref 24–36)

## 2016-06-25 NOTE — Progress Notes (Signed)
Physical Therapy Treatment Patient Details Name: Barry Jones MRN: [id number] DOB: March 19, 1931 Today's Date: 06/25/2016    History of Present Illness Pt is a 82 yo male admitted after pt starting having trouble seeing lunch plate, developed slurred speech, R gaze preference and had tonic/clonic sz activity in the L sdie.  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 Pt was admitted in 8/17 with similar symptoms, was given TPA and returned home with son and [facility] services. Pt with h/o afib, significant dementia, DMII.    PT Comments    Pt at this time requiring total A for all mobility with very limited participation from pt despite pt verbalizing willingness to perform task.  [name] present during session, but does not observe and stands behind curtain throughout session.  Unclear if family truly grasp level of A required to care for pt.  Continue to feel pt will need SNF level of care.    Follow Up Recommendations  SNF     Equipment Recommendations  None recommended by PT    Recommendations for Other Services       Precautions / Restrictions Precautions Precautions: Fall Precaution Comments: seizures Restrictions Weight Bearing Restrictions: No    Mobility  Bed Mobility Overal bed mobility: Needs Assistance;+2 for physical assistance Bed Mobility: Supine to Sit     Supine to sit: Total assist;+2 for physical assistance;HOB elevated     General bed mobility comments: pt with minimal participation in bed mobility.    Transfers Overall transfer level: Needs assistance Equipment used: 2 person hand held assist Transfers: Sit to/from [employer] Sit to Stand: Total assist;+2 physical assistance Stand pivot transfers: Total assist;+2 physical assistance       General transfer comment: pt needs Bil feet blocked and facilitation for anterior weight shift over BOS.  pt does seem to bear some weight in LEs, but needs A for movement of each LE through pivot to recliner.     Ambulation/Gait                 Stairs            Wheelchair Mobility    Modified Rankin (Stroke Patients Only)       Balance Overall balance assessment: Needs assistance Sitting-balance support: Bilateral upper extremity supported;Feet supported Sitting balance-Leahy Scale: Zero Sitting balance - Comments: pt leans posteriorly and to R side without attempts at righting.  pt indicates feeling as if he is leaning anteriorly.   Postural control: Posterior lean;Right lateral lean Standing balance support: During functional activity;Bilateral upper extremity supported Standing balance-Leahy Scale: Zero                      Cognition Arousal/Alertness:  (Drowsy, but will arouse with stimuli) Behavior During Therapy: WFL for tasks assessed/performed Overall Cognitive Status: Impaired/Different from baseline Area of Impairment: Orientation;Attention;Memory;Following commands;Safety/judgement;Awareness;Problem solving Orientation Level: Disoriented to;Place;Time;Situation Current Attention Level: Focused Memory: Decreased recall of precautions;Decreased short-term memory Following Commands: Follows one step commands inconsistently;Follows one step commands with increased time Safety/Judgement: Decreased awareness of safety;Decreased awareness of deficits Awareness: Intellectual Problem Solving: Slow processing;Decreased initiation;Difficulty sequencing;Requires verbal cues;Requires tactile cues General Comments: pt's HOH does impact cognitive assessment, but pt clearly with cognitive deficits and not just HOH.  pt with Dementia at baseline, but per daughter this is worse than baseline cognition.  discussed with daughter strategies for occupying pt's attention as he tends to start grabbing lines when not engaged.      Exercises  General Comments        Pertinent Vitals/Pain Pain Assessment: Faces Faces Pain Scale: Hurts a little bit Pain Location:  Unclear mroe fatigued than actual pain.    Home Living                      Prior Function            PT Goals (current goals can now be found in the care plan section) Acute Rehab PT Goals Patient Stated Goal: None stated PT Goal Formulation: With family Time For Goal Achievement: 07/06/16 Potential to Achieve Goals: Fair Progress towards PT goals: Not progressing toward goals - comment (minimal effort)    Frequency    Min 3X/week      PT Plan Frequency needs to be updated    Co-evaluation             End of Session Equipment Utilized During Treatment: Gait belt Activity Tolerance: Patient limited by fatigue Patient left: in chair;with call bell/phone within reach;with chair alarm set;with family/visitor present     Time: [id number] PT Time Calculation (min) (ACUTE ONLY): 30 min  Charges:  $Therapeutic Activity: 23-37 mins                    G CodesSunny Schlein:      Baxter Gonzalez F, [place]PT [phone] 06/25/2016, 2:50 PM

## 2016-06-25 NOTE — Consult Note (Signed)
Consultation Note Date: 06/25/2016   Patient Name: Barry Jones  DOB: 1930/09/23  MRN: [id number]  Age / Sex: 82 y.o., male  PCP: Nicoletta Dress, MD Referring Physician: Wallis Bamberg, MD  Reason for Consultation: Establishing goals of care  HPI/Patient Profile: 82 y.o. male    admitted on 06/21/2016    Clinical Assessment and Goals of Care:  Patient is an elderly gentleman with a past medical history significant for cerebrovascular accident, diabetes, hypertension, atrial fibrillation, dementia. Patient presented with confusion and gaze preference and was diagnosed with possible acute cerebrovascular accident. Patient had a stroke August 2017 that resulted in left-sided weakness. At that time, patient also had asymptomatic hemorrhagic transformation of his strokes after TPA administration.  Acute issues in this hospitalization have been possible complex partial seizures, patient has been seen by neurology, has had antiepileptic drugs are started and adjusted. Concern about new cerebrovascular accident, ongoing encephalopathy. BS concern has been dysphasia. Speech and which pathology has been following closely. Patient has been deemed not safe to have any kind of oral diet. Hence, palliative consultation has been obtained for additional goals of care discussions.  The patient initially in afternoon on 10-18. Son was at the bedside. Briefly introduced scope of palliative services. Son asked that I come back and speak with code healthcare power of attorney agent daughter Juliann Pulse.  Met with Juliann Pulse in the patient's room this morning. Patient is appearing weak resting in chair. He opens his eyes and attempts to engage but does not answer or respond meaningfully. He does not appear to be in any acute distress. Detailed discussions with daughter Juliann Pulse about role of artificial nutrition and hydration in the face of  serious chronic and acute irreversible conditions such as stroke and dementia. Discussed about appropriateness of careful hand assisted oral feedings following as much aspiration precautions as possible. Discussed about possible in appropriateness of PEG tube placement. All questions answered to the best of my ability. See recommendations below. We will continue to follow along. HCPOA  Daughter Tye Maryland who is now at the bedside is the second healthcare power of attorney agent. Patient's main healthcare power of attorney is his son who is currently in [place] and is due to arrive back in [place] over the weekend.  SUMMARY OF RECOMMENDATIONS    Family is requesting for repeat speech-language pathology evaluation. Detailed discussions with daughter Tye Maryland about comfort feedings. Discussed about medical recommendation being to not proceed with PEG tube placement. Discussed about careful hand assisted oral feeding would be most appropriate in patients with advanced dementia and recurrent strokes.  As far as additional goals of care discussions, CODE STATUS discussions, family is asking to wait/hold off. They would like to have a repeat swallow eval and assess if the patient can be started safely on some kind of oral diet. Palliative will continue to follow along  Code Status/Advance Care Planning:  Full code    Symptom Management:     Continue current treatment measures Palliative Prophylaxis:   Bowel Regimen  Psycho-social/Spiritual:   Desire for further Chaplaincy support:no  Additional Recommendations: Caregiving  Support/Resources  Prognosis:   Guarded.   Discharge Planning: To Be Determined      Primary Diagnoses: Present on Admission: . CVA (cerebral vascular accident) ([place]) . Atrial fibrillation ([place]) . Dementia . Essential (primary) hypertension . Hyperlipidemia LDL goal <70 . Dehydration . Acute encephalopathy   I have reviewed the medical record,  interviewed the patient and family, and examined the patient. The following aspects are pertinent.  Past Medical History:  Diagnosis Date  . Chronic atrial fibrillation ([place])   . Diabetes mellitus type II, controlled ([place])   . HTN (hypertension)   . Stroke [facility]) 04/2016   Social History   Social History  . Marital status: Widowed    Spouse name: N/A  . Number of children: N/A  . Years of education: N/A   Social History Main Topics  . Smoking status: Former Research scientist (life sciences)  . Smokeless tobacco: Never Used     Comment: quit many years ago  . Alcohol use No  . Drug use: No  . Sexual activity: Not Asked   Other Topics Concern  . None   Social History Narrative  . None   History reviewed. No pertinent family history. Scheduled Meds: .  stroke: mapping our early stages of recovery book   Does not apply Once  . atorvastatin  20 mg Oral q1800  . chlorhexidine  15 mL Mouth Rinse BID  . insulin aspart  0-9 Units Subcutaneous TID WC  . levETIRAcetam  500 mg Intravenous Q12H  . LORazepam  1 mg Intravenous Once  . mouth rinse  15 mL Mouth Rinse q12n4p  . metoprolol  2.5 mg Intravenous Q6H   Continuous Infusions: . sodium chloride 75 mL/hr at 06/25/16 0030  . heparin 950 Units/hr (06/24/16 2154)   PRN Meds:.haloperidol lactate, LORazepam Medications Prior to Admission:  Prior to Admission medications   Medication Sig Start Date End Date Taking? Authorizing Provider  atenolol (TENORMIN) 25 MG tablet Take 25 mg by mouth 3 (three) times daily.  08/27/15  Yes Historical Provider, MD  atorvastatin (LIPITOR) 20 MG tablet Take 1 tablet (20 mg total) by mouth daily at 6 PM. 04/21/16  Yes Donzetta Starch, NP  Cyanocobalamin (B-12 PO) Take 1 tablet by mouth daily.   Yes Historical Provider, MD  ELIQUIS 5 MG TABS tablet Take 5 mg by mouth daily. 06/10/16  Yes Historical Provider, MD  furosemide (LASIX) 40 MG tablet Take 40 mg by mouth daily. 06/15/16  Yes Historical Provider, MD  MELATONIN PO Take 1  tablet by mouth daily.   Yes Historical Provider, MD  metFORMIN (GLUCOPHAGE) 500 MG tablet Take 500 mg by mouth 2 (two) times daily with a meal.   Yes Historical Provider, MD  nitrofurantoin (MACRODANTIN) 100 MG capsule Take 100 mg by mouth daily. 06/19/16  Yes Historical Provider, MD   Allergies  Allergen Reactions  . Ciprofloxacin Other (See Comments)    Made pt crazy   Review of Systems Mostly confused is not able to answer questions appropriately Physical Exam Elderly gentleman appears weak but appears calm and comfortable S1-S2 irregularly irregular Lungs clear in the anterior lung fields next line abdomen soft nondistended No edema Patient awakens easily but is not fully alert. He thinks I'm his niece.  Vital Signs: BP (!) 151/98 (BP Location: Left Arm)   Pulse 75   Temp 98 F (36.7 C) (Axillary)   Resp 20   Wt 70.4  kg (155 lb 3.3 oz)   SpO2 98%  Pain Assessment: PAINAD       SpO2: SpO2: 98 % O2 Device:SpO2: 98 % O2 Flow Rate: .O2 Flow Rate (L/min): 2 L/min  IO: Intake/output summary:  Intake/Output Summary (Last 24 hours) at 06/25/16 1400 Last data filed at 06/24/16 2100  Gross per 24 hour  Intake                0 ml  Output              650 ml  Net             -650 ml    LBM: Last BM Date: 06/24/16 Baseline Weight: Weight: 70.2 kg (154 lb 12.2 oz) Most recent weight: Weight: 70.4 kg (155 lb 3.3 oz)     Palliative Assessment/Data:   Flowsheet Rows   Flowsheet Row Most Recent Value  Intake Tab  Referral Department  Hospitalist  Unit at Time of Referral  Cardiac/Telemetry Unit  Palliative Care Primary Diagnosis  Neurology  Palliative Care Type  New Palliative care  Reason for referral  Clarify Goals of Care  Date first seen by Palliative Care  06/24/16  Clinical Assessment  Palliative Performance Scale Score  30%  Pain Max last 24 hours  4  Pain Min Last 24 hours  3  Dyspnea Max Last 24 Hours  3  Dyspnea Min Last 24 hours  2  Nausea Max Last 24  Hours  3  Nausea Min Last 24 Hours  2  Psychosocial & Spiritual Assessment  Palliative Care Outcomes  Patient/Family meeting held?  Yes  Who was at the meeting?  patient, daughter Tye Maryland, another son.   Palliative Care Outcomes  Clarified goals of care  Palliative Care follow-up planned  Yes, Facility      Time In:  10 Time Out:  11.10 Time Total:  70  Greater than 50%  of this time was spent counseling and coordinating care related to the above assessment and plan.  Signed by: Loistine Chance, MD  [phone]  Please contact Palliative Medicine Team phone at [phone] for questions and concerns.  For individual provider: See Shea Evans

## 2016-06-25 NOTE — Care Management Important Message (Signed)
Important Message  Patient Details  Name: Barry GashDallas Jones MRN: [id number] Date of Birth: 12/20/1930   Medicare Important Message Given:  Yes    Barry Jones 06/25/2016, 11:01 AM

## 2016-06-25 NOTE — Progress Notes (Signed)
PROGRESS NOTE    Barry GashDallas Jones  WUJ:[id number] DOB: 10/16/1930 DOA: 06/21/2016 PCP: Paulina FusiSCHULTZ,DOUGLAS E, MD   Brief Narrative:  82 y.o.malewith medical history significant of CVA, HTN, DMII, afib on Eliquis, and dementia; who presents with confusion and gaze preference. His son reports that he was having trouble seeing what was on his plate while he was trying to eat lunch and then started staring off into space. His speech became slurred, and stopped speaking, and he developed a gaze preference. He was taken to the ED where CT was interpreted as showing an acute nonhemorrhagic infarct in the high right parietal lobe with stable left PCA territory encephalomalacia. The pt was [facility] where neurology was consulted. The patient was given fosphenytoin with resolution of the continuous seizure but he continued to have occasional brief focal seizures on the left. He was then loaded with Keppra 1500 mg IV.   He had as stroke in August 2017 that resulted in left-sided weakness. He recovered well, however, and received home PT/OT with minimal residual deficits. On review of the chart, [facility]8/15/17. He presented at the time with the acute onset of left hemiparesis and R gaze. He was given IV tPA with improvement in symptoms after infusion. Workup revealed scattered infarcts in the R MCA territory, the largest of which was in the R parietal lobe. He had some asymptomatic hemorrhagic transformation of his strokes after tPA. It was felt that his strokes were the result of atrial fibrillation for which he was not anticoagulated. At the time of discharge, it was recommended that he have a repeat CT of the head in three weeks and if no hemorrhage was seen that he be started on Eliquis for secondary stroke prevention.   Assessment & Plan:   Principal Problem:   Acute encephalopathy Active Problems:   Essential (primary) hypertension   Atrial fibrillation (HCC)  Dementia   Hyperlipidemia LDL goal <70   Diabetes mellitus type II, controlled (HCC)   CVA (cerebral vascular accident) (HCC)   Dehydration   Dysphagia   Dysphagia, oropharyngeal phase   Acute Encephalopathy -multifactorial including seizures and anticholinergic medications (given benadryl), ??new stroke -04/20/2016 serum B12 672 -Urinalysis negative for pyuria -04/20/2016 TSH 0.996 -Check ammonia--11 - patient awake but not conversive and did not follow commands - remains NPO- serious concern for dysphagia - will repeat SLP evaluation today prior to decisions for goals of care  Complex partial status epilepticus -manifested bytonic-clonic activity of the left arm and leg with his head turned up and to the leftwitness by neurology--consistent with his recent right MCA territory stroke being the focus.  -AEDs per neurology--d/c dilantin due to interaction with apixaban, started keppra -EEG diffuse slowing - Keppra 500mg  BID  Cerebrovascular disease--concerned about new stroke  -had a right MCA territory infarct in August 2017 that was felt to be cardioembolic in nature due to underlying atrial fibrillation -recently started on Eliquis for secondary prevention and this can be continued per neurology  -06/23/16 CT brain--2 small Right cerebellar infarcts since 05/12/16 - on heparin now 2/2 dysphagia  Dysphagia -failed MBS -remain npo -d/c apixaban -start IV heparin without bolus  Goals of Care -son/family open to GOC discussion -consult palliative medicine -remain FULL CODE pending palliative medicine discussion - daughter requesting repeat swallow screen prior to decision on treatment  LLL Infiltrate -will NOT start abx -no fever, no leukocytosis, no hypoxia -monitor clinically  Dementia:  -His family describes what sounds like  a fairly significant underlying dementia.  -places him at increased risk for delirium from all causes and could predict delayed and  possibly incomplete recovery from the same. -Avoid CNS active medications, particularly benzodiazepines, opiates, and anything with strong anticholinergic properties -haldol prn - patient unable to participate in exam today  Chronic Atrial Fibrillation -rate controlled -continue apixaban when able to tolerate PO -change atenolol to IV lopressor -CHADSVASc = 7  Diabetes Mellitus type 2 -04/19/2016 and hemoglobin A1c 6.4 -NovoLog sliding scale -Allow for liberalglycemic control  Hyperlipidemia -Continue statin when able to take po  Dehydration -continue IVF -am BMP  HTN -change atenolol to IV lopressor  Hypomagnesemia  -replete -Mg this am at 2.0   Disposition Plan: SNF vs residential hospice Family Communication: daughter, son and nephew bedside Code Status: FULLe DVT Prophylaxis: apixaban-->IV heparin    Consultants:   Neurology  Palliative Medicine  Procedures:   none  Antimicrobials:   none    Subjective: Patient seen and evaluated.  Minimally verbal at exam but was able to awaken when daughter repeatedly said "Dad".  Patient somewhat interactive with daughter and son per their report but no interaction noted at time of exam.  Will have goals of care discussion today.  Discussed options for nutrition.  Family requesting repeat SLP eval today.  Objective: Vitals:   06/24/16 2100 06/25/16 0104 06/25/16 0608 06/25/16 1028  BP: (!) 165/98 (!) 160/97 133/78 (!) 162/68  Pulse: (!) 107 (!) 105 90 90  Resp: 20 20 20 20   Temp: 97.8 F (36.6 C) 98.3 F (36.8 C) 97.9 F (36.6 C) 97.5 F (36.4 C)  TempSrc: Oral Axillary Axillary Axillary  SpO2: 92% 95% 93% 97%  Weight:        Intake/Output Summary (Last 24 hours) at 06/25/16 1126 Last data filed at 06/24/16 2100  Gross per 24 hour  Intake                0 ml  Output              650 ml  Net             -650 ml   Filed Weights   06/21/16 2100 06/22/16 0012  Weight: 70.2 kg (154 lb  12.2 oz) 70.4 kg (155 lb 3.3 oz)    Examination:  General exam: Appears calm and comfortable  Respiratory system: Clear to auscultation. Respiratory effort normal. Cardiovascular system: S1 & S2 heard, irregularly irregular rhythm. No JVD, murmurs, rubs, gallops or clicks. No pedal edema. Gastrointestinal system: Abdomen is nondistended, soft and nontender. No organomegaly or masses felt. Normal bowel sounds heard. Central nervous system: awakened but unable to determine alertness. No focal neurological deficits. Extremities: unable to assess. Skin: No rashes, lesions or ulcers Psychiatry: unable to assess as patient not participatory.     Data Reviewed: I have personally reviewed following labs and imaging studies  CBC:  Recent Labs Lab 06/22/16 0345 06/24/16 0440 06/25/16 0508  WBC 7.1 8.6 8.7  NEUTROABS 4.8  --   --   HGB 13.8 13.8 12.8*  HCT 42.1 41.7 39.0  MCV 93.3 92.1 92.4  PLT 155 147* 152   Basic Metabolic Panel:  Recent Labs Lab 06/22/16 0345 06/23/16 0415 06/24/16 0440  NA 142 139 137  K 3.5 3.5 3.3*  CL 101 101 102  CO2 30 25 24   GLUCOSE 121* 149* 146*  BUN 24* 14 14  CREATININE 0.98 0.94 0.86  CALCIUM 8.6* 8.2* 7.9*  MG  --  1.2* 2.0   GFR: CrCl cannot be calculated (Unknown ideal weight.). Liver Function Tests:  Recent Labs Lab 06/22/16 0345  ALBUMIN 3.5   No results for input(s): LIPASE, AMYLASE in the last 168 hours.  Recent Labs Lab 06/22/16 1707  AMMONIA 11   Coagulation Profile: No results for input(s): INR, PROTIME in the last 168 hours. Cardiac Enzymes: No results for input(s): CKTOTAL, CKMB, CKMBINDEX, TROPONINI in the last 168 hours. BNP (last 3 results) No results for input(s): PROBNP in the last 8760 hours. HbA1C: No results for input(s): HGBA1C in the last 72 hours. CBG:  Recent Labs Lab 06/24/16 1134 06/24/16 1731 06/24/16 2048 06/25/16 0829 06/25/16 1124  GLUCAP 107* 128* 85 152* 70   Lipid Profile: No  results for input(s): CHOL, HDL, LDLCALC, TRIG, CHOLHDL, LDLDIRECT in the last 72 hours. Thyroid Function Tests:  Recent Labs  06/22/16 1707  TSH 1.384   Anemia Panel: No results for input(s): VITAMINB12, FOLATE, FERRITIN, TIBC, IRON, RETICCTPCT in the last 72 hours. Sepsis Labs: No results for input(s): PROCALCITON, LATICACIDVEN in the last 168 hours.  No results found for this or any previous visit (from the past 240 hour(s)).       Radiology Studies: Ct Head Wo Contrast  Result Date: 06/23/2016 CLINICAL DATA:  Acute encephalopathy. EXAM: CT HEAD WITHOUT CONTRAST TECHNIQUE: Contiguous axial images were obtained from the base of the skull through the vertex without intravenous contrast. COMPARISON:  05/12/2016 FINDINGS: Brain: 2 small areas of low-density in the peripheral right cerebellum are new from 05/12/2016 head CT and brain MRI 04/19/2016. No interval supratentorial infarction. Expected evolution of right parietal infarct with resolved high-density and decreasing mass effect. Remote left inferior occipital infarct with dense gliosis. Atrophy with ventriculomegaly. Mild small vessel ischemic change in the cerebral white matter. Remote lacunar infarct in the left thalamus. Vascular: Atherosclerotic calcification. Skull: Negative Sinuses/Orbits: Bilateral cataract resection.  No acute finding. IMPRESSION: 1. Two small right cerebellar infarcts have occurred since comparison 05/12/2016. 2. Expected evolution of recent right parietal infarct. Superimposed hemorrhage has resolved. 3. Remote left occipital infarct. 4. Atrophy and ventriculomegaly. Electronically Signed   By: Marnee Spring M.D.   On: 06/23/2016 12:27        Scheduled Meds: .  stroke: mapping our early stages of recovery book   Does not apply Once  . atorvastatin  20 mg Oral q1800  . chlorhexidine  15 mL Mouth Rinse BID  . insulin aspart  0-9 Units Subcutaneous TID WC  . levETIRAcetam  500 mg Intravenous Q12H  .  LORazepam  1 mg Intravenous Once  . mouth rinse  15 mL Mouth Rinse q12n4p  . metoprolol  2.5 mg Intravenous Q6H   Continuous Infusions: . sodium chloride 75 mL/hr at 06/25/16 0030  . heparin 950 Units/hr (06/24/16 2154)     LOS: 4 days    Time spent: 30 minutes    Bennett Scrape, MD [facility] Pager [phone]  If 7PM-7AM, please contact night-coverage [email or web address] Password [email or web address] 06/25/2016, 11:26 AM

## 2016-06-25 NOTE — Progress Notes (Signed)
ANTICOAGULATION CONSULT NOTE Pharmacy Consult for heparin Indication: atrial fibrillation  Allergies  Allergen Reactions  . Ciprofloxacin Other (See Comments)    Made pt crazy    Patient Measurements: Weight: 155 lb 3.3 oz (70.4 kg) Heparin Dosing Weight: 70kg  Vital Signs: Temp: 97.9 F (36.6 C) (10/19 0608) Temp Source: Axillary (10/19 0608) BP: 133/78 (10/19 0608) Pulse Rate: 90 (10/19 0608)  Labs:  Recent Labs  06/23/16 0415  06/23/16 2324 06/24/16 0440 06/24/16 0903 06/24/16 2034 06/25/16 0508  HGB  --   --   --  13.8  --   --  12.8*  HCT  --   --   --  41.7  --   --  39.0  PLT  --   --   --  147*  --   --  152  APTT  --   < > 81*  --  133* 65* 79*  HEPARINUNFRC  --   < > 0.88*  --  0.87*  --  0.58  CREATININE 0.94  --   --  0.86  --   --   --   < > = values in this interval not displayed.  CrCl cannot be calculated (Unknown ideal weight.).   Medical History: Past Medical History:  Diagnosis Date  . Chronic atrial fibrillation (HCC)   . Diabetes mellitus type II, controlled (HCC)   . HTN (hypertension)     Assessment: [age] yom with dementia, on apixaban PTA for afib. Patient now unable to take PO - Pharmacy consulted to dose heparin. Patient had not had any doses of apixaban inpatient to this point, last dose was 10/15 per med rec. Hg down 12.8, plt stable 152.  Heparin level this AM (0.58), aPTT now therapeutic 79 after rate increase. No bleeding documented. Will check one more daily heparin level to ensure correlating with aPTT.  Goal of Therapy:  Heparin level 0.3-0.7 units/ml  APTT 66-102 Monitor platelets by anticoagulation protocol: Yes   Plan:  Heparin at 950 units/h Daily heparin level/aPTT/CBC Monitor for s/sx bleeding F/u anticoagulation plan   Babs BertinHaley Eiliana Drone, PharmD, BCPS Clinical Pharmacist 06/25/2016 8:40 AM

## 2016-06-25 NOTE — Progress Notes (Signed)
SLP was paged by PT stating daughter reports pt was to bee seen this am by SLP.  SLP was not assigned to see patient today per this SLP's recommendations. SLP spoke to Country[employer] FinancialN Daphne via phone and relayed information re: pt's severity of dysphagia prohibiting adequate nutrition with airway protection.  Advised son [name] yesterday *only family present* that decisions will need to be made re: pt's nutrition. Please see yesterday's detailed note.  Will follow up AFTER palliative meeting to help educate family/pt regarding dysphagia mitigation strategies if indicated. Thanks.   Donavan Burnetamara Lee Kalt, MS [facility] SLP   [phone]

## 2016-06-26 ENCOUNTER — Inpatient Hospital Stay (HOSPITAL_COMMUNITY): Payer: Medicare Other

## 2016-06-26 DIAGNOSIS — R1310 Dysphagia, unspecified: Secondary | ICD-10-CM

## 2016-06-26 LAB — GLUCOSE, CAPILLARY
GLUCOSE-CAPILLARY: 152 mg/dL — AB (ref 65–99)
GLUCOSE-CAPILLARY: 150 mg/dL — AB (ref 65–99)
GLUCOSE-CAPILLARY: 144 mg/dL — AB (ref 65–99)
GLUCOSE-CAPILLARY: 176 mg/dL — AB (ref 65–99)
Glucose-Capillary: 141 mg/dL — ABNORMAL HIGH (ref 65–99)

## 2016-06-26 LAB — CBC
HCT: 42.8 % (ref 39.0–52.0)
HEMOGLOBIN: 14.1 g/dL (ref 13.0–17.0)
MCH: 30.6 pg (ref 26.0–34.0)
MCHC: 32.9 g/dL (ref 30.0–36.0)
MCV: 92.8 fL (ref 78.0–100.0)
PLATELETS: 178 10*3/uL (ref 150–400)
RBC: 4.61 MIL/uL (ref 4.22–5.81)
RDW: 15.2 % (ref 11.5–15.5)
WBC: 7.7 10*3/uL (ref 4.0–10.5)

## 2016-06-26 LAB — HEPARIN LEVEL (UNFRACTIONATED): Heparin Unfractionated: 0.49 IU/mL (ref 0.30–0.70)

## 2016-06-26 LAB — APTT: aPTT: 74 seconds — ABNORMAL HIGH (ref 24–36)

## 2016-06-26 MED ORDER — METOPROLOL TARTRATE 5 MG/5ML IV SOLN
2.5000 mg | Freq: Once | INTRAVENOUS | Status: AC
  Administered 2016-06-26: 2.5 mg via INTRAVENOUS
  Filled 2016-06-26: qty 5

## 2016-06-26 MED ORDER — RESOURCE THICKENUP CLEAR PO POWD
ORAL | Status: DC | PRN
  Filled 2016-06-26 (×2): qty 125

## 2016-06-26 NOTE — Progress Notes (Addendum)
ANTICOAGULATION CONSULT NOTE Pharmacy Consult for heparin Indication: atrial fibrillation  Allergies  Allergen Reactions  . Ciprofloxacin Other (See Comments)    Made pt crazy    Patient Measurements: Height: 5\' 9"  (175.3 cm) Weight: 155 lb 3.3 oz (70.4 kg) IBW/kg (Calculated) : 70.7 Heparin Dosing Weight: 70kg  Vital Signs: Temp: 98.4 F (36.9 C) (10/20 0632) Temp Source: Oral (10/20 16100632) BP: 134/104 (10/20 96040632) Pulse Rate: 146 (10/20 0632)  Labs:  Recent Labs  06/24/16 0440 06/24/16 0903 06/24/16 2034 06/25/16 0508 06/26/16 0214  HGB 13.8  --   --  12.8* 14.1  HCT 41.7  --   --  39.0 42.8  PLT 147*  --   --  152 178  APTT  --  133* 65* 79* 74*  HEPARINUNFRC  --  0.87*  --  0.58 0.49  CREATININE 0.86  --   --   --   --     Estimated Creatinine Clearance: 64.8 mL/min (by C-G formula based on SCr of 0.86 mg/dL).   Medical History: Past Medical History:  Diagnosis Date  . Chronic atrial fibrillation (HCC)   . Diabetes mellitus type II, controlled (HCC)   . HTN (hypertension)   . Stroke [facility]) 04/2016    Assessment: [age] yom with dementia, on apixaban PTA for afib. Patient now unable to take PO - Pharmacy consulted to dose heparin. Patient had not had any doses of apixaban inpatient to this point, last dose was 10/15 per med rec. CBC wnl.  Heparin level this AM therapeutic (0.49), aPTT therapeutic 79. No bleeding documented. Appears heparin level/aPTT are correlating, will d/c daily aPTT.  Goal of Therapy:  Heparin level 0.3-0.7 units/ml  APTT 66-102 Monitor platelets by anticoagulation protocol: Yes   Plan:  Heparin at 950 units/h Daily heparin level/CBC Monitor for s/sx bleeding F/u anticoagulation plan, GOC discussion   Babs BertinHaley Dawne Casali, PharmD, BCPS Clinical Pharmacist 06/26/2016 8:32 AM

## 2016-06-26 NOTE — Progress Notes (Signed)
Physical Therapy Treatment Patient Details Name: Barry Jones MRN: [id number] DOB: 24-Nov-1930 Today's Date: 06/26/2016    History of Present Illness Pt is a 82 yo male admitted after pt starting having trouble seeing lunch plate, developed slurred speech, R gaze preference and had tonic/clonic sz activity in the L sdie.  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 Pt was admitted in 8/17 with similar symptoms, was given TPA and returned home with son and [facility] services. Pt with h/o afib, significant dementia, DMII.    PT Comments    Current plan remains appropriate. Pt continues to require max/total A +2 for all mobility. Son present for session. PT will continue to follow acutely.   Follow Up Recommendations  SNF     Equipment Recommendations  None recommended by PT    Recommendations for Other Services       Precautions / Restrictions Precautions Precautions: Fall Restrictions Weight Bearing Restrictions: No    Mobility  Bed Mobility Overal bed mobility: Needs Assistance;+2 for physical assistance Bed Mobility: Supine to Sit     Supine to sit: Max assist;+2 for physical assistance     General bed mobility comments: assist at trunk and bilat LE to raise up into sitting with pt assisting to bring bilat LE to EOB and reached for rail toward R side; max multimodal cues for initiation and attneding to tasks  Transfers Overall transfer level: Needs assistance Equipment used: 2 person hand held assist Transfers: Sit to/from [employer] Sit to Stand: Max assist;+2 physical assistance Stand pivot transfers: Total assist;+2 physical assistance       General transfer comment: assist to power up into standing with +2 face to face transfers with therapists blocking bilat knees and advancing feet during pivot; pt assisted minimally with pushing up from EOB; maintained flexed posture  Ambulation/Gait                 Stairs            Wheelchair Mobility     Modified Rankin (Stroke Patients Only)       Balance Overall balance assessment: Needs assistance Sitting-balance support: Feet supported;Bilateral upper extremity supported Sitting balance-Leahy Scale: Poor Sitting balance - Comments: Posterior and R lateral lean in sitting. Unable to self correct.  Postural control: Posterior lean;Right lateral lean Standing balance support: Bilateral upper extremity supported Standing balance-Leahy Scale: Zero                      Cognition Arousal/Alertness: Awake/alert Behavior During Therapy: WFL for tasks assessed/performed Overall Cognitive Status: Impaired/Different from baseline Area of Impairment: Orientation;Attention;Memory;Following commands;Safety/judgement;Awareness;Problem solving Orientation Level: Disoriented to;Situation;Time Current Attention Level: Focused Memory: Decreased recall of precautions;Decreased short-term memory Following Commands: Follows one step commands consistently Safety/Judgement: Decreased awareness of safety;Decreased awareness of deficits Awareness: Intellectual Problem Solving: Slow processing;Decreased initiation;Difficulty sequencing;Requires verbal cues;Requires tactile cues General Comments: Pt more alert and participatory today. Son reports this is the most "awake" he has seen pt in awhile.    Exercises      General Comments General comments (skin integrity, edema, etc.): son present for session; upon sitting EOB pt c/o "itchy" lower back and was scratching area; pt with rash/bumps on bilateral sides of lower lumbar; RN aware and assessed      Pertinent Vitals/Pain Pain Assessment: Faces Faces Pain Scale: No hurt    Home Living  Prior Function            PT Goals (current goals can now be found in the care plan section) Acute Rehab PT Goals Patient Stated Goal: None stated Progress towards PT goals: Progressing toward goals    Frequency     Min 3X/week      PT Plan Current plan remains appropriate    Co-evaluation PT/OT/SLP Co-Evaluation/Treatment: Yes Reason for Co-Treatment: For patient/therapist safety   OT goals addressed during session: ADL's and self-care     End of Session Equipment Utilized During Treatment: Gait belt Activity Tolerance: Patient tolerated treatment well Patient left: in chair;with call bell/phone within reach;with chair alarm set;with family/visitor present     Time: [id number] PT Time Calculation (min) (ACUTE ONLY): 28 min  Charges:  $Therapeutic Activity: 8-22 mins                    G Codes:      Derek MoundKellyn R Marjory Meints Darick Fetters, PTA Pager: [phone]   06/26/2016, 4:14 PM

## 2016-06-26 NOTE — Evaluation (Addendum)
Clinical/Bedside Swallow Evaluation Patient Details  Name: Barry Jones MRN: [id number] Date of Birth: 08/03/1931  Today's Date: 06/26/2016 Time: SLP Start Time (ACUTE ONLY): 0850 SLP Stop Time (ACUTE ONLY): 0920 SLP Time Calculation (min) (ACUTE ONLY): 30 min  Past Medical History:  Past Medical History:  Diagnosis Date  . Chronic atrial fibrillation (HCC)   . Diabetes mellitus type II, controlled (HCC)   . HTN (hypertension)   . Stroke [facility](HCC) 04/2016   Past Surgical History:  Past Surgical History:  Procedure Laterality Date  . HERNIA REPAIR    . PROSTATE SURGERY     HPI:  Pt is an 82 y.o. male with PMH of CVA 04/2016, HTN, DMII, afib on Eliquis, and dementia; who presented to ED 10/15 with confusion and gaze preference to the R with left-side weakness. Head CT showed that 2 small cerebellar infarcts had occurred since 05/12/16, evolution of recent R parietal infarct. MBS showed moderate oral/ severe pharyngeal swallow with sensorimotor deficits, weak pharyngeal swallow/ significant residuals/ mild silent aspiration of liquid consistencies and secretions, NPO recommendations. CXR showed LLL PNA. Also suspected partial seizures. Palliative meeting 10/19- "Family is requesting for repeat speech-language pathology evaluation. Detailed discussions with daughter Lynden AngCathy about comfort feedings. Discussed about medical recommendation being to not proceed with PEG tube placement. Discussed about careful hand assisted oral feeding would be most appropriate in patients with advanced dementia and recurrent strokes." Bedside swallow eval ordered.   Assessment / Plan / Recommendation Clinical Impression  Pt with improved cognitive status compared to previous MD and therapy notes. Upon arrival pt was alert and conversant with son at bedside, oriented to self and place, able to answer simple questions and follow 1-step commands with visual cues. Still confused as to situation. Pt tolerated ice chips and  small bites of puree without overt s/s of aspiration. Did have immediate cough with trials of thin liquid and tendency to "slurp" from cup rather than take normal sips. Noted palliative consult from previous date with family requesting a re-evaluation, and given some functional improvements noted at bedside, pt may have improved swallow function. Recommend repeat MBS to objectively evaluate swallow function. Plan for MBS this afternoon. In the meantime allow pt to have several ice chips following oral care.    Aspiration Risk  Severe aspiration risk    Diet Recommendation NPO (ice chips after oral care)   Medication Administration: Via alternative means Postural Changes: Seated upright at 90 degrees    Other  Recommendations Oral Care Recommendations: Oral care QID   Follow up Recommendations  (TBD)      Frequency and Duration min 2x/week  2 weeks       Prognosis Prognosis for Safe Diet Advancement: Fair Barriers to Reach Goals: Severity of deficits;Cognitive deficits;Other (Comment)      Swallow Study   General HPI: Pt is an 82 y.o. male with PMH of CVA 04/2016, HTN, DMII, afib on Eliquis, and dementia; who presented to ED 10/15 with confusion and gaze preference to the R with left-side weakness. Head CT showed that 2 small cerebellar infarcts had occurred since 05/12/16, evolution of recent R parietal infarct. MBS showed moderate oral/ severe pharyngeal swallow with sensorimotor deficits, weak pharyngeal swallow/ significant residuals/ mild silent aspiration of liquid consistencies and secretions, NPO recommendations. CXR showed LLL PNA. Also suspected partial seizures. Palliative meeting 10/19- "Family is requesting for repeat speech-language pathology evaluation. Detailed discussions with daughter Lynden AngCathy about comfort feedings. Discussed about medical recommendation being to not proceed with PEG tube  placement. Discussed about careful hand assisted oral feeding would be most appropriate in  patients with advanced dementia and recurrent strokes." Bedside swallow eval ordered. Type of Study: Bedside Swallow Evaluation Previous Swallow Assessment: MBS 10/17- severe aspiration risk- NPO recommendations Diet Prior to this Study: NPO Temperature Spikes Noted: No Respiratory Status: Room air History of Recent Intubation: No Behavior/Cognition: Alert;Cooperative;Pleasant mood;Confused Oral Cavity Assessment: Within Functional Limits Oral Cavity - Dentition: Dentures, top Vision: Functional for self-feeding Self-Feeding Abilities: Needs assist Patient Positioning: Upright in bed Baseline Vocal Quality: Normal    Oral/Motor/Sensory Function Overall Oral Motor/Sensory Function: Within functional limits   Ice Chips Ice chips: Within functional limits Presentation: Spoon   Thin Liquid Thin Liquid: Impaired Presentation: Cup Oral Phase Impairments: Reduced lingual movement/coordination Pharyngeal  Phase Impairments: Cough - Immediate    Nectar Thick Nectar Thick Liquid: Not tested   Honey Thick Honey Thick Liquid: Not tested   Puree Puree: Within functional limits Presentation: Spoon   Solid   GO   Solid: Not tested        Metro Kung, MA, CCC-SLP 06/26/2016,9:29 AM [phone]

## 2016-06-26 NOTE — Progress Notes (Signed)
MBSS complete. Full report located under chart review in imaging section.  Recommendations: Will defer diet order to MD. Continued high risk of aspiration. Safest PO diet, if pursued, would be dysphagia 1/ pudding thick liquids. If thinner liquids desired, nectar thick via tsp would be an alternative although pt did have trace aspiration of this consistency. Full supervision- allow pt to self feed if possible, cue to swallow 2 times, minimize environmental distractions. Oral care before and after PO intake. Ice chips following oral care if desired. Will continue to follow for education.  Thana AtesAmy Davine Coba, MA, CCC-SLP [phone]

## 2016-06-26 NOTE — Progress Notes (Signed)
PROGRESS NOTE    Barry Jones  AVW:[id number] DOB: 06/11/1931 DOA: 06/21/2016 PCP: Paulina FusiSCHULTZ,DOUGLAS E, MD   Brief Narrative:  82 y.o.malewith medical history significant of CVA, HTN, DMII, afib on Eliquis, and dementia; who presents with confusion and gaze preference. His son reports that he was having trouble seeing what was on his plate while he was trying to eat lunch and then started staring off into space. His speech became slurred, and stopped speaking, and he developed a gaze preference. He was taken to the ED where CT was interpreted as showing an acute nonhemorrhagic infarct in the high right parietal lobe with stable left PCA territory encephalomalacia. [facility] neurology was [facility]. The patient was given fosphenytoin with resolution of the continuous seizure but he continued to have occasional brief focal seizures on the left. He was then loaded with Keppra 1500 mg IV.   He had as stroke in August 2017 that resulted in left-sided weakness. He recovered well, however, and received home PT/OT with minimal residual deficits. On review of the chart, [facility]. He presented at the time with the acute onset of left hemiparesis and R gaze. He was given IV tPA with improvement in symptoms after infusion. Workup revealed scattered infarcts in the R MCA territory, the largest of which was in the R parietal lobe. He had some asymptomatic hemorrhagic transformation of his strokes after tPA. It was felt that his strokes were the result of atrial fibrillation for which he was not anticoagulated. At the time of discharge, it was recommended that he have a repeat CT of the head in three weeks and if no hemorrhage was seen that he be started on Eliquis for secondary stroke prevention.   Assessment & Plan:   Principal Problem:   Acute encephalopathy Active Problems:   Essential (primary) hypertension   Atrial fibrillation (HCC)  Dementia   Hyperlipidemia LDL goal <70   Diabetes mellitus type II, controlled (HCC)   CVA (cerebral vascular accident) (HCC)   Dehydration   Dysphagia   Dysphagia, oropharyngeal phase   Acute Encephalopathy -multifactorial including seizures and anticholinergic medications (given benadryl), ??new stroke -80/14/2017 serum B12 672 -Urinalysis negative for pyuria -04/20/2016 TSH 0.996 - Appears to be clearing - patient awake, conversive, not following commands - repeat barium swallow today  Complex partial status epilepticus -manifested bytonic-clonic activity of the left arm and leg with his head turned up and to the leftwitness by neurology--consistent with his recent right MCA territory stroke being the focus.  -AEDs per neurology--d/c dilantin due to interaction with apixaban, started keppra -EEG diffuse slowing - Keppra 500mg  BID  Cerebrovascular disease--concerned about new stroke  -had a right MCA territory infarct in August 2017 that was felt to be cardioembolic in nature due to underlying atrial fibrillation -recently started on Eliquis for secondary prevention and this can be continued per neurology  -06/23/16 CT brain--2 small Right cerebellar infarcts since 05/12/16 - on heparin now 2/2 dysphagia  Dysphagia -failed MBS -repeat barium swallow today -d/c apixaban -start IV heparin without bolus - will change diet order pending results of barium swallow  Goals of Care -son/family open to GOC discussion -consult palliative medicine -remain FULL CODE   LLL Infiltrate -will NOT start abx -no fever, no leukocytosis, no hypoxia -monitor clinically  Dementia:  -His family describes what sounds like a fairly significant underlying dementia.  -places him at increased risk for delirium from all causes and could  predict delayed and possibly incomplete recovery from the same. -Avoid CNS active medications, particularly benzodiazepines, opiates, and anything with  strong anticholinergic properties -haldol prn  Chronic Atrial Fibrillation -rate controlled -continue apixaban when able to tolerate PO -change atenolol to IV lopressor -CHADSVASc = 7  Diabetes Mellitus type 2 -04/19/2016 and hemoglobin A1c 6.4 -NovoLog sliding scale -Allow for liberalglycemic control  Hyperlipidemia -Continue statin when able to take po  Dehydration -continue IVF -am BMP  HTN -change atenolol to IV lopressor  Hypomagnesemia  -replete -Mg this am at 2.0   Disposition Plan: SNF likely Family Communication: son and two nephews are bedside Code Status: FULL DVT Prophylaxis: apixaban-->IV heparin    Consultants:   Neurology  Palliative Medicine  Procedures:   none  Antimicrobials:   none    Subjective: Patient seen after repeat barium swallow.  He is in good spirits- appears awake and was telling jokes.  His son is bedside and says patient has been talking almost nonstop today.  Patient does respond to some questions but is very hard of hearing.  States that he is hungry.  Was able to be evaluated by OT and SLP today.  Family agreeable to SNF.  Objective: Vitals:   06/25/16 2143 06/26/16 0632 06/26/16 0951 06/26/16 1421  BP: (!) 149/110 (!) 134/104 (!) 145/92 (!) 154/116  Pulse: (!) 110 (!) 146 98 95  Resp: 20 20 19 18   Temp: 97.8 F (36.6 C) 98.4 F (36.9 C) 98.1 F (36.7 C) 97.9 F (36.6 C)  TempSrc: Oral Oral Oral Oral  SpO2: 95% 94% 94% 95%  Weight:      Height:        Intake/Output Summary (Last 24 hours) at 06/26/16 1505 Last data filed at 06/26/16 1100  Gross per 24 hour  Intake             4200 ml  Output              500 ml  Net             3700 ml   Filed Weights   06/21/16 2100 06/22/16 0012  Weight: 70.2 kg (154 lb 12.2 oz) 70.4 kg (155 lb 3.3 oz)    Examination:  General exam: Appears calm and comfortable  Respiratory system: Clear to auscultation. Respiratory effort normal. Cardiovascular  system: S1 & S2 heard, irregularly irregular rhythm. No JVD, murmurs, rubs, gallops or clicks. No pedal edema. Gastrointestinal system: Abdomen is nondistended, soft and nontender. No organomegaly or masses felt. Normal bowel sounds heard. Central nervous system: awake, alert, not oriented except to person. Could not do thorough evaluation of CN Extremities: able to move arms and legs spontaneously- could lift legs up on command Skin: No rashes, lesions or ulcers Psychiatry: unable to assess as patient not participatory.     Data Reviewed: I have personally reviewed following labs and imaging studies  CBC:  Recent Labs Lab 06/22/16 0345 06/24/16 0440 06/25/16 0508 06/26/16 0214  WBC 7.1 8.6 8.7 7.7  NEUTROABS 4.8  --   --   --   HGB 13.8 13.8 12.8* 14.1  HCT 42.1 41.7 39.0 42.8  MCV 93.3 92.1 92.4 92.8  PLT 155 147* 152 178   Basic Metabolic Panel:  Recent Labs Lab 06/22/16 0345 06/23/16 0415 06/24/16 0440  NA 142 139 137  K 3.5 3.5 3.3*  CL 101 101 102  CO2 30 25 24   GLUCOSE 121* 149* 146*  BUN 24* 14 14  CREATININE  0.98 0.94 0.86  CALCIUM 8.6* 8.2* 7.9*  MG  --  1.2* 2.0   GFR: Estimated Creatinine Clearance: 64.8 mL/min (by C-G formula based on SCr of 0.86 mg/dL). Liver Function Tests:  Recent Labs Lab 06/22/16 0345  ALBUMIN 3.5   No results for input(s): LIPASE, AMYLASE in the last 168 hours.  Recent Labs Lab 06/22/16 1707  AMMONIA 11   Coagulation Profile: No results for input(s): INR, PROTIME in the last 168 hours. Cardiac Enzymes: No results for input(s): CKTOTAL, CKMB, CKMBINDEX, TROPONINI in the last 168 hours. BNP (last 3 results) No results for input(s): PROBNP in the last 8760 hours. HbA1C: No results for input(s): HGBA1C in the last 72 hours. CBG:  Recent Labs Lab 06/25/16 1643 06/25/16 2105 06/26/16 0819 06/26/16 0853 06/26/16 1209  GLUCAP 87 116* 141* 152* 150*   Lipid Profile: No results for input(s): CHOL, HDL, LDLCALC,  TRIG, CHOLHDL, LDLDIRECT in the last 72 hours. Thyroid Function Tests: No results for input(s): TSH, T4TOTAL, FREET4, T3FREE, THYROIDAB in the last 72 hours. Anemia Panel: No results for input(s): VITAMINB12, FOLATE, FERRITIN, TIBC, IRON, RETICCTPCT in the last 72 hours. Sepsis Labs: No results for input(s): PROCALCITON, LATICACIDVEN in the last 168 hours.  No results found for this or any previous visit (from the past 240 hour(s)).       Radiology Studies: No results found.      Scheduled Meds: .  stroke: mapping our early stages of recovery book   Does not apply Once  . atorvastatin  20 mg Oral q1800  . chlorhexidine  15 mL Mouth Rinse BID  . insulin aspart  0-9 Units Subcutaneous TID WC  . levETIRAcetam  500 mg Intravenous Q12H  . LORazepam  1 mg Intravenous Once  . mouth rinse  15 mL Mouth Rinse q12n4p  . metoprolol  2.5 mg Intravenous Q6H   Continuous Infusions: . sodium chloride 75 mL/hr at 06/26/16 0659  . heparin 950 Units/hr (06/25/16 2030)     LOS: 5 days    Time spent: 30 minutes    Bennett Scrape, MD [facility] Pager [phone]  If 7PM-7AM, please contact night-coverage [email or web address] Password [email or web address] 06/26/2016, 3:05 PM

## 2016-06-26 NOTE — Progress Notes (Signed)
Occupational Therapy Treatment Patient Details Name: Barry Jones MRN: [id number] DOB: 03/19/1931 Today's Date: 06/26/2016    History of present illness Pt is a 82 yo male admitted after pt starting having trouble seeing lunch plate, developed slurred speech, R gaze preference and had tonic/clonic sz activity in the L sdie.  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 Pt was admitted in 8/17 with similar symptoms, was given TPA and [facility]. Pt with h/o afib, significant dementia, DMII.   OT comments  Pt more alert and participatory with therapy today. Continues to require max-total assist +2 for OOB activities. Educated family on talking to pt on L side for head turn and attention to L. Pt noted to have rash/itching on lower back/buttocks area; RN called to room and will monitor. D/c plan remains appropriate. Will continue to follow acutely.   Follow Up Recommendations  SNF;Supervision/Assistance - 24 hour    Equipment Recommendations  None recommended by OT    Recommendations for Other Services      Precautions / Restrictions Precautions Precautions: Fall Restrictions Weight Bearing Restrictions: No       Mobility Bed Mobility Overal bed mobility: Needs Assistance;+2 for physical assistance Bed Mobility: Supine to Sit     Supine to sit: Max assist;+2 for physical assistance     General bed mobility comments: Pt able to assist with moving LEs to EOB and reaching with L arm to assist with sitting up. Requires max cues to initiate and seqence activity.  Transfers Overall transfer level: Needs assistance Equipment used: 2 person hand held assist Transfers: Sit to/from [employer]Stand;Stand Pivot Transfers Sit to Stand: Max assist;+2 physical assistance Stand pivot transfers: Total assist;+2 physical assistance       General transfer comment: Pt able to assist slightly with sit to stand from EOB with max assist +2 blocking bil knees and use of bed pad to boost  up. Total assist for stand pivot.    Balance Overall balance assessment: Needs assistance Sitting-balance support: Feet supported;Bilateral upper extremity supported Sitting balance-Leahy Scale: Poor Sitting balance - Comments: Posterior and R lateral lean in sitting. Unable to self correct.  Postural control: Posterior lean;Right lateral lean Standing balance support: Bilateral upper extremity supported Standing balance-Leahy Scale: Zero                     ADL Overall ADL's : Needs assistance/impaired         Upper Body Bathing: Maximal assistance;Sitting Upper Body Bathing Details (indicate cue type and reason): Assist for sitting balance and washing back due to itching.             Toilet Transfer: Total assistance;+2 for physical assistance;Stand-pivot;BSC Toilet Transfer Details (indicate cue type and reason): Simulated by stand pivot from EOB to chair         Functional mobility during ADLs: Total assistance;+2 for physical assistance (for stand pivot only) General ADL Comments: Pt appropriately following one step commands today but continues to be disoriented and confused. Educated pts son on talking to pt on L side to encourage head turn and attention toward L. Pt noted to have rash on lower back/buttocks area; RN called to room and will monitor.      Vision                     Perception     Praxis      Cognition   Behavior During Therapy: [facility] for tasks  assessed/performed Overall Cognitive Status: Impaired/Different from baseline Area of Impairment: Orientation;Attention;Memory;Following commands;Safety/judgement;Awareness;Problem solving Orientation Level: Disoriented to;Situation;Time Current Attention Level: Focused Memory: Decreased recall of precautions;Decreased short-term memory  Following Commands: Follows one step commands consistently Safety/Judgement: Decreased awareness of safety;Decreased awareness of deficits Awareness:  Intellectual Problem Solving: Slow processing;Decreased initiation;Difficulty sequencing;Requires verbal cues;Requires tactile cues General Comments: Pt more alert and participatory today. Son reports this is the most "awake" he has seen pt in awhile.    Extremity/Trunk Assessment               Exercises     Shoulder Instructions       General Comments      Pertinent Vitals/ Pain       Pain Assessment: Faces Faces Pain Scale: No hurt  Home Living                                          Prior Functioning/Environment              Frequency  Min 2X/week        Progress Toward Goals  OT Goals(current goals can now be found in the care plan section)  Progress towards OT goals: Progressing toward goals  Acute Rehab OT Goals Patient Stated Goal: None stated OT Goal Formulation: With patient/family  Plan Discharge plan remains appropriate    Co-evaluation    PT/OT/SLP Co-Evaluation/Treatment: Yes Reason for Co-Treatment: For patient/therapist safety   OT goals addressed during session: ADL's and self-care      End of Session Equipment Utilized During Treatment: Gait belt   Activity Tolerance Patient tolerated treatment well   Patient Left in chair;with call bell/phone within reach;with chair alarm set;with family/visitor present   Nurse Communication Mobility status;Other (comment) (rash on lower back)        Time: [id number] OT Time Calculation (min): 28 min  Charges: OT General Charges $OT Visit: 1 Procedure OT Treatments $Self Care/Home Management : 8-22 mins  Gaye Alken M.S., OTR/L Pager: [phone]  06/26/2016, 2:11 PM

## 2016-06-27 DIAGNOSIS — R569 Unspecified convulsions: Secondary | ICD-10-CM

## 2016-06-27 LAB — BASIC METABOLIC PANEL
Anion gap: 6 (ref 5–15)
BUN: 19 mg/dL (ref 6–20)
CHLORIDE: 116 mmol/L — AB (ref 101–111)
CO2: 24 mmol/L (ref 22–32)
CREATININE: 0.67 mg/dL (ref 0.61–1.24)
Calcium: 8.3 mg/dL — ABNORMAL LOW (ref 8.9–10.3)
GFR calc Af Amer: >60 mL/min (ref >60)
GFR calc non Af Amer: >60 mL/min (ref >60)
GLUCOSE: 181 mg/dL — AB (ref 65–99)
Potassium: 3 mmol/L — ABNORMAL LOW (ref 3.5–5.1)
Sodium: 146 mmol/L — ABNORMAL HIGH (ref 135–145)

## 2016-06-27 LAB — GLUCOSE, CAPILLARY
GLUCOSE-CAPILLARY: 155 mg/dL — AB (ref 65–99)
GLUCOSE-CAPILLARY: 241 mg/dL — AB (ref 65–99)
Glucose-Capillary: 204 mg/dL — ABNORMAL HIGH (ref 65–99)
Glucose-Capillary: 220 mg/dL — ABNORMAL HIGH (ref 65–99)

## 2016-06-27 LAB — CBC
HCT: 38.3 % — ABNORMAL LOW (ref 39.0–52.0)
Hemoglobin: 12.5 g/dL — ABNORMAL LOW (ref 13.0–17.0)
MCH: 30.2 pg (ref 26.0–34.0)
MCHC: 32.6 g/dL (ref 30.0–36.0)
MCV: 92.5 fL (ref 78.0–100.0)
PLATELETS: 162 10*3/uL (ref 150–400)
RBC: 4.14 MIL/uL — AB (ref 4.22–5.81)
RDW: 15.3 % (ref 11.5–15.5)
WBC: 6.5 10*3/uL (ref 4.0–10.5)

## 2016-06-27 LAB — HEPARIN LEVEL (UNFRACTIONATED): Heparin Unfractionated: 0.32 IU/mL (ref 0.30–0.70)

## 2016-06-27 MED ORDER — POTASSIUM CHLORIDE 20 MEQ PO PACK
20.0000 meq | PACK | Freq: Two times a day (BID) | ORAL | Status: DC
  Administered 2016-06-27 – 2016-06-29 (×5): 20 meq via ORAL
  Filled 2016-06-27 (×5): qty 1

## 2016-06-27 NOTE — Progress Notes (Signed)
PROGRESS NOTE    Barry Jones  ZOX:[id number] DOB: December 12, 1930 DOA: 06/21/2016 PCP: Paulina Fusi, MD   Brief Narrative:  82 y.o.malewith medical history significant of CVA, HTN, DMII, afib on Eliquis, and dementia; who presents with confusion and gaze preference. His son reports that he was having trouble seeing what was on his plate while he was trying to eat lunch and then started staring off into space. His speech became slurred, and stopped speaking, and he developed a gaze preference. He was taken to the ED where CT was interpreted as showing an acute nonhemorrhagic infarct in the high right parietal lobe with stable left PCA territory encephalomalacia. The pt was transferred to [facility] where neurology was consulted. The patient was given fosphenytoin with resolution of the continuous seizure but he continued to have occasional brief focal seizures on the left. He was then loaded with Keppra 1500 mg IV.   He had as stroke in August 2017 that resulted in left-sided weakness. He recovered well, however, and received home PT/OT with minimal residual deficits. On review of the chart, he was admitted to [facility] from 8/12-8/15/17. He presented at the time with the acute onset of left hemiparesis and R gaze. He was given IV tPA with improvement in symptoms after infusion. Workup revealed scattered infarcts in the R MCA territory, the largest of which was in the R parietal lobe. He had some asymptomatic hemorrhagic transformation of his strokes after tPA. It was felt that his strokes were the result of atrial fibrillation for which he was not anticoagulated. At the time of discharge, it was recommended that he have a repeat CT of the head in three weeks and if no hemorrhage was seen that he be started on Eliquis for secondary stroke prevention.  Patient slowly became more alert and a repeat barium swallow was ordered on 10/20.  Patient still noted to be a high risk of aspiration and would be best  suited with a dysphagia 1 diet of pudding thick liquids.   Assessment & Plan:   Principal Problem:   Acute encephalopathy Active Problems:   Essential (primary) hypertension   Atrial fibrillation (HCC)   Dementia   Hyperlipidemia LDL goal <70   Diabetes mellitus type II, controlled (HCC)   CVA (cerebral vascular accident) (HCC)   Dehydration   Dysphagia   Dysphagia, oropharyngeal phase   Acute Encephalopathy -multifactorial including seizures and anticholinergic medications (given benadryl), ??new stroke -04/20/2016 serum B12 672 -Urinalysis negative for pyuria -04/20/2016 TSH 0.996 - Appears to be clearing - patient awake, conversive, following 2 step commands - repeat barium swallow yesterday- still a high risk of aspiration  Complex partial status epilepticus -manifested bytonic-clonic activity of the left arm and leg with his head turned up and to the leftwitness by neurology--consistent with his recent right MCA territory stroke being the focus.  -AEDs per neurology--d/c dilantin due to interaction with apixaban, started keppra -EEG diffuse slowing - Keppra 500mg  BID  Cerebrovascular disease--concerned about new stroke  -had a right MCA territory infarct in August 2017 that was felt to be cardioembolic in nature due to underlying atrial fibrillation -recently started on Eliquis for secondary prevention and this can be continued per neurology  -06/23/16 CT brain--2 small Right cerebellar infarcts since 05/12/16 - on heparin now 2/2 dysphagia - can consider transitioning to PO apixaban if able to swallow pills  Dysphagia -failed MBS initially -repeat barium swallow yesterday -d/c apixaban -start IV heparin without bolus - Dysphagia 1 diet - will  attempt to transition back to PO medication today and tomorrow  Goals of Care -son/family open to GOC discussion -consult palliative medicine -remain FULL CODE   LLL Infiltrate -will NOT start abx -no fever, no  leukocytosis, no hypoxia -monitor clinically  Dementia:  -His family describes what sounds like a fairly significant underlying dementia.  -places him at increased risk for delirium from all causes and could predict delayed and possibly incomplete recovery from the same. -Avoid CNS active medications, particularly benzodiazepines, opiates, and anything with strong anticholinergic properties -haldol prn  Chronic Atrial Fibrillation -rate controlled -continue apixaban when able to tolerate PO -change atenolol to IV lopressor -CHADSVASc = 7  Diabetes Mellitus type 2 -04/19/2016 and hemoglobin A1c 6.4 -NovoLog sliding scale -Allow for liberalglycemic control  Hyperlipidemia -Continue statin when able to take po  Dehydration - repeat BMP Cr 0.67  HTN -change atenolol to IV lopressor - will transition back when patient taking consistent PO and tolerating pills  Hypomagnesemia  -replete -Mg at 2.0  Hypokalemia - will give potassium liquid 30mEq - repeat BMP in am  Disposition Plan: SNF; discussed with family, will decide what facility. Patient not ready for discharge yet- need to ensure able to tolerate PO and can transition his medications back to PO route.  Family Communication: son and two nephews are bedside Code Status: FULL DVT Prophylaxis: apixaban-->IV heparin    Consultants:   Neurology  Palliative Medicine  PT/OT   Procedures:   Barium swallow 10/20  Antimicrobials:   none    Subjective: Patient asleep at time of exam.  Son states a day ago he had a normal conversation with patient.  He ate well last night and ate well this morning.  Son mentions patient seems to have a hard time with staying awake at night. Mentions that when patient awakens there are moments of clarity.  Placement again discussed with patient.  Objective: Vitals:   06/26/16 2213 06/27/16 0115 06/27/16 0619 06/27/16 0949  BP: (!) 176/82 (!) 172/90 (!) 173/90 (!)  171/89  Pulse: 82 72 81 82  Resp: 18 18 18 18   Temp: 98.7 F (37.1 C) 97.8 F (36.6 C) 97.8 F (36.6 C) 97.9 F (36.6 C)  TempSrc: Oral Oral Oral Oral  SpO2: 96% 95% 95% 96%  Weight:      Height:        Intake/Output Summary (Last 24 hours) at 06/27/16 1112 Last data filed at 06/26/16 1800  Gross per 24 hour  Intake              550 ml  Output                0 ml  Net              550 ml   Filed Weights   06/21/16 2100 06/22/16 0012  Weight: 70.2 kg (154 lb 12.2 oz) 70.4 kg (155 lb 3.3 oz)    Examination:  General exam: Appears calm and comfortable  Respiratory system: Clear to auscultation. Respiratory effort normal. Cardiovascular system: S1 & S2 heard, irregularly irregular rhythm. No JVD, murmurs, rubs, gallops or clicks. No pedal edema. Gastrointestinal system: Abdomen is nondistended, soft and nontender. No organomegaly or masses felt. Normal bowel sounds heard. Central nervous system: awake, alert, not oriented except to person. Could not do thorough evaluation of CN Extremities: able to move arms and legs spontaneously- could lift legs up on command, flex and extend at ankle and grip hands Skin: No rashes,  lesions or ulcers Psychiatry: unable to assess as patient not participatory and was drowsy.     Data Reviewed: I have personally reviewed following labs and imaging studies  CBC:  Recent Labs Lab 06/22/16 0345 06/24/16 0440 06/25/16 0508 06/26/16 0214 06/27/16 0517  WBC 7.1 8.6 8.7 7.7 6.5  NEUTROABS 4.8  --   --   --   --   HGB 13.8 13.8 12.8* 14.1 12.5*  HCT 42.1 41.7 39.0 42.8 38.3*  MCV 93.3 92.1 92.4 92.8 92.5  PLT 155 147* 152 178 162   Basic Metabolic Panel:  Recent Labs Lab 06/22/16 0345 06/23/16 0415 06/24/16 0440 06/27/16 1002  NA 142 139 137 146*  K 3.5 3.5 3.3* 3.0*  CL 101 101 102 116*  CO2 30 25 24 24   GLUCOSE 121* 149* 146* 181*  BUN 24* 14 14 19   CREATININE 0.98 0.94 0.86 0.67  CALCIUM 8.6* 8.2* 7.9* 8.3*  MG  --  1.2*  2.0  --    GFR: Estimated Creatinine Clearance: 69.7 mL/min (by C-G formula based on SCr of 0.67 mg/dL). Liver Function Tests:  Recent Labs Lab 06/22/16 0345  ALBUMIN 3.5   No results for input(s): LIPASE, AMYLASE in the last 168 hours.  Recent Labs Lab 06/22/16 1707  AMMONIA 11   Coagulation Profile: No results for input(s): INR, PROTIME in the last 168 hours. Cardiac Enzymes: No results for input(s): CKTOTAL, CKMB, CKMBINDEX, TROPONINI in the last 168 hours. BNP (last 3 results) No results for input(s): PROBNP in the last 8760 hours. HbA1C: No results for input(s): HGBA1C in the last 72 hours. CBG:  Recent Labs Lab 06/26/16 1209 06/26/16 1718 06/26/16 2208 06/27/16 0607 06/27/16 1106  GLUCAP 150* 144* 176* 155* 204*   Lipid Profile: No results for input(s): CHOL, HDL, LDLCALC, TRIG, CHOLHDL, LDLDIRECT in the last 72 hours. Thyroid Function Tests: No results for input(s): TSH, T4TOTAL, FREET4, T3FREE, THYROIDAB in the last 72 hours. Anemia Panel: No results for input(s): VITAMINB12, FOLATE, FERRITIN, TIBC, IRON, RETICCTPCT in the last 72 hours. Sepsis Labs: No results for input(s): PROCALCITON, LATICACIDVEN in the last 168 hours.  No results found for this or any previous visit (from the past 240 hour(s)).       Radiology Studies: Dg Swallowing Func-speech Pathology  Result Date: 06/26/2016 Objective Swallowing Evaluation: Type of Study: MBS-Modified Barium Swallow Study Patient Details Name: Barry Jones MRN: [id number] Date of Birth: 19-Aug-1931 Today's Date: 06/26/2016 Time: SLP Start Time (ACUTE ONLY): 1345-SLP Stop Time (ACUTE ONLY): 1415 SLP Time Calculation (min) (ACUTE ONLY): 30 min Past Medical History: Past Medical History: Diagnosis Date . Chronic atrial fibrillation (HCC)  . Diabetes mellitus type II, controlled (HCC)  . HTN (hypertension)  . Stroke [facility]) 04/2016 Past Surgical History: Past Surgical History: Procedure Laterality Date . HERNIA REPAIR    . PROSTATE SURGERY   HPI: Pt is an 82 y.o. male with PMH of CVA 04/2016, HTN, DMII, afib on Eliquis, and dementia; who presented to ED 10/15 with confusion and gaze preference to the R with left-side weakness. Head CT showed that 2 small cerebellar infarcts had occurred since 05/12/16, evolution of recent R parietal infarct. MBS showed moderate oral/ severe pharyngeal swallow with sensorimotor deficits, weak pharyngeal swallow/ significant residuals/ mild silent aspiration of liquid consistencies and secretions, NPO recommendations. CXR showed LLL PNA. Also suspected partial seizures. Palliative meeting 10/19- "Family is requesting for repeat speech-language pathology evaluation. Detailed discussions with daughter Lynden Ang about comfort feedings. Discussed about medical recommendation being  to not proceed with PEG tube placement. Discussed about careful hand assisted oral feeding would be most appropriate in patients with advanced dementia and recurrent strokes." Bedside swallow eval ordered. Subjective: pt awake in chair Assessment / Plan / Recommendation CHL IP CLINICAL IMPRESSIONS 06/26/2016 Therapy Diagnosis Moderate oral phase dysphagia;Severe pharyngeal phase dysphagia Clinical Impression Unfortunately the pt continues to present with a moderate oral/ severe pharyngeal dysphagia which is sensorimotor in nature, not much change from previous study on 10/17. Pt with reduced awareness of bolus, delayed oral transit, and premature spill to the level of the pyriform sinuses with both thin and nectar-thick liquids, all of which were silently aspirated either before or during the swallow given decreased base of tongue retraction and pharyngeal peristalsis. Pt also with significant post-swallow residuals in the vallecula, pyriform sinuses, and at the CP segment which were not sensed by the pt. The pt was most successful with puree consistencies, likely due to the increased sensory input with a heavier bolus. Penetration/  aspiration was not observed with puree; however, there may still be a risk of aspiration of this consistency given pt's inconsistencies of bolus awareness. Of the liquid consistencies, the safest trials appeared to be nectar-thick by teaspoon, although the pt did have trace aspiration of this consistency x1 as well. Provided education to son who was present for the study. Aspiration risk continues to be high. Will defer diet order to MD. The safest diet consistency appears to be dysphagia 1 and pudding-thick liquids by teaspoon. If thinner liquid consistencies are desired, nectar-thick by teaspoon would be the recommendation. Pt may not have adequate nutrition/ hydration with this diet recommendation. Ensure that pt is seated upright during meals and cue to swallow 2 times to clear any residuals, minimize distractions. Would also recommend oral care before and after meals. Ice chips can be provided after thorough oral care if desired (no more than 5 at a time). Will continue to follow for continued education/ monitor tolerance of POs.  Impact on safety and function Severe aspiration risk   CHL IP TREATMENT RECOMMENDATION 06/26/2016 Treatment Recommendations Therapy as outlined in treatment plan below   Prognosis 06/26/2016 Prognosis for Safe Diet Advancement Fair Barriers to Reach Goals Severity of deficits;Cognitive deficits Barriers/Prognosis Comment -- CHL IP DIET RECOMMENDATION 06/26/2016 SLP Diet Recommendations Depending on pt/ family wishes the options are NPO;Ice chips PRN after oral care; Dysphagia 1 (Puree) solids;Pudding thick liquid;Other (Comment). If thinner liquid desired, may try nectar via tsp Liquid Administration via Spoon Medication Administration Crushed with puree Compensations Minimize environmental distractions;Slow rate;Small sips/bites;Multiple dry swallows after each bite/sip Postural Changes Seated upright at 90 degrees;Remain semi-upright after after feeds/meals (Comment)   CHL IP OTHER  RECOMMENDATIONS 06/26/2016 Recommended Consults -- Oral Care Recommendations Oral care before and after PO Other Recommendations Order thickener from pharmacy;Clarify dietary restrictions   CHL IP FOLLOW UP RECOMMENDATIONS 06/26/2016 Follow up Recommendations Home health SLP   CHL IP FREQUENCY AND DURATION 06/26/2016 Speech Therapy Frequency (ACUTE ONLY) min 2x/week Treatment Duration 2 weeks      CHL IP ORAL PHASE 06/26/2016 Oral Phase Impaired Oral - Pudding Teaspoon -- Oral - Pudding Cup -- Oral - Honey Teaspoon -- Oral - Honey Cup -- Oral - Nectar Teaspoon Weak lingual manipulation;Reduced posterior propulsion;Premature spillage Oral - Nectar Cup Weak lingual manipulation;Reduced posterior propulsion;Delayed oral transit;Premature spillage;Decreased bolus cohesion Oral - Nectar Straw Weak lingual manipulation;Delayed oral transit;Premature spillage Oral - Thin Teaspoon Weak lingual manipulation;Reduced posterior propulsion;Decreased bolus cohesion;Premature spillage Oral - Thin Cup --  Oral - Thin Straw -- Oral - Puree Delayed oral transit;Premature spillage;Other (Comment) Oral - Mech Soft Delayed oral transit;Premature spillage;Weak lingual manipulation Oral - Regular -- Oral - Multi-Consistency -- Oral - Pill -- Oral Phase - Comment --  CHL IP PHARYNGEAL PHASE 06/26/2016 Pharyngeal Phase Impaired Pharyngeal- Pudding Teaspoon -- Pharyngeal -- Pharyngeal- Pudding Cup -- Pharyngeal -- Pharyngeal- Honey Teaspoon -- Pharyngeal -- Pharyngeal- Honey Cup -- Pharyngeal -- Pharyngeal- Nectar Teaspoon Delayed swallow initiation-pyriform sinuses;Reduced pharyngeal peristalsis;Reduced epiglottic inversion;Reduced tongue base retraction;Penetration/Aspiration during swallow;Pharyngeal residue - valleculae;Pharyngeal residue - cp segment;Trace aspiration Pharyngeal Material enters airway, passes BELOW cords without attempt by patient to eject out (silent aspiration) Pharyngeal- Nectar Cup Delayed swallow initiation-pyriform  sinuses;Reduced pharyngeal peristalsis;Reduced epiglottic inversion;Reduced tongue base retraction;Penetration/Aspiration before swallow;Penetration/Aspiration during swallow;Moderate aspiration;Pharyngeal residue - valleculae Pharyngeal -- Pharyngeal- Nectar Straw Delayed swallow initiation-pyriform sinuses;Reduced tongue base retraction;Reduced pharyngeal peristalsis;Reduced epiglottic inversion;Penetration/Aspiration before swallow;Trace aspiration;Pharyngeal residue - valleculae;Pharyngeal residue - cp segment Pharyngeal Material enters airway, passes BELOW cords without attempt by patient to eject out (silent aspiration) Pharyngeal- Thin Teaspoon Delayed swallow initiation-pyriform sinuses;Reduced pharyngeal peristalsis;Reduced epiglottic inversion;Reduced laryngeal elevation;Penetration/Aspiration before swallow;Trace aspiration;Pharyngeal residue - valleculae Pharyngeal Material enters airway, passes BELOW cords without attempt by patient to eject out (silent aspiration) Pharyngeal- Thin Cup -- Pharyngeal -- Pharyngeal- Thin Straw -- Pharyngeal -- Pharyngeal- Puree Delayed swallow initiation-vallecula;Pharyngeal residue - valleculae Pharyngeal -- Pharyngeal- Mechanical Soft Other (Comment) Pharyngeal -- Pharyngeal- Regular -- Pharyngeal -- Pharyngeal- Multi-consistency -- Pharyngeal -- Pharyngeal- Pill -- Pharyngeal -- Pharyngeal Comment --  CHL IP CERVICAL ESOPHAGEAL PHASE 06/26/2016 Cervical Esophageal Phase Impaired Pudding Teaspoon -- Pudding Cup -- Honey Teaspoon -- Honey Cup -- Nectar Teaspoon Reduced cricopharyngeal relaxation Nectar Cup Reduced cricopharyngeal relaxation Nectar Straw Reduced cricopharyngeal relaxation Thin Teaspoon Reduced cricopharyngeal relaxation Thin Cup -- Thin Straw -- Puree Reduced cricopharyngeal relaxation Mechanical Soft Reduced cricopharyngeal relaxation Regular -- Multi-consistency -- Pill -- Cervical Esophageal Comment -- No flowsheet data found. Metro Kung, MA,  CCC-SLP 06/26/2016, 3:10 PM [phone]                  Scheduled Meds: .  stroke: mapping our early stages of recovery book   Does not apply Once  . atorvastatin  20 mg Oral q1800  . chlorhexidine  15 mL Mouth Rinse BID  . insulin aspart  0-9 Units Subcutaneous TID WC  . levETIRAcetam  500 mg Intravenous Q12H  . LORazepam  1 mg Intravenous Once  . mouth rinse  15 mL Mouth Rinse q12n4p  . metoprolol  2.5 mg Intravenous Q6H   Continuous Infusions: . sodium chloride 75 mL/hr at 06/26/16 2012  . heparin 950 Units/hr (06/27/16 0037)     LOS: 6 days    Time spent: 30 minutes    Bennett Scrape, MD [facility] Pager [phone]  If 7PM-7AM, please contact night-coverage [email or web address] Password [email or web address] 06/27/2016, 11:12 AM

## 2016-06-27 NOTE — Progress Notes (Signed)
ANTICOAGULATION CONSULT NOTE Pharmacy Consult for Heparin Indication: atrial fibrillation  Assessment: [age] yom with dementia, on apixaban PTA for afib. Pharmacy consulted to dose heparin. Last dose of apixaban was 10/15, and has not received any inpatient. Heparin level is therapeutic at 0.32 on 950 units/hr. Hemoglobin slightly low, platelets wnl, no bleeding noted.   Goal of Therapy:  Heparin level 0.3-0.7 units/ml  Monitor platelets by anticoagulation protocol: Yes   Plan:  Continue heparin at 950 units/h Daily heparin level/CBC Monitor for s/sx bleeding F/u anticoagulation plan, GOC discussion   Allergies  Allergen Reactions  . Ciprofloxacin Other (See Comments)    Made pt crazy    Patient Measurements: Height: 5\' 9"  (175.3 cm) Weight: 155 lb 3.3 oz (70.4 kg) IBW/kg (Calculated) : 70.7 Heparin Dosing Weight: 70kg  Vital Signs: Temp: 97.9 F (36.6 C) (10/21 0949) Temp Source: Oral (10/21 0949) BP: 171/89 (10/21 0949) Pulse Rate: 82 (10/21 0949)  Labs:  Recent Labs  06/24/16 2034  06/25/16 0508 06/26/16 0214 06/27/16 0517 06/27/16 1002  HGB  --   < > 12.8* 14.1 12.5*  --   HCT  --   --  39.0 42.8 38.3*  --   PLT  --   --  152 178 162  --   APTT 65*  --  79* 74*  --   --   HEPARINUNFRC  --   --  0.58 0.49 0.32  --   CREATININE  --   --   --   --   --  0.67  < > = values in this interval not displayed.  Estimated Creatinine Clearance: 69.7 mL/min (by C-G formula based on SCr of 0.67 mg/dL).   Medical History: Past Medical History:  Diagnosis Date  . Chronic atrial fibrillation (HCC)   . Diabetes mellitus type II, controlled (HCC)   . HTN (hypertension)   . Stroke [facility](HCC) 04/2016    Allie BossierApryl Anderson, PharmD PGY1 Pharmacy Resident [phone] (Pager) 06/27/2016 10:55 AM

## 2016-06-28 LAB — GLUCOSE, CAPILLARY
GLUCOSE-CAPILLARY: 189 mg/dL — AB (ref 65–99)
GLUCOSE-CAPILLARY: 199 mg/dL — AB (ref 65–99)
GLUCOSE-CAPILLARY: 212 mg/dL — AB (ref 65–99)
Glucose-Capillary: 275 mg/dL — ABNORMAL HIGH (ref 65–99)

## 2016-06-28 LAB — HEPARIN LEVEL (UNFRACTIONATED): HEPARIN UNFRACTIONATED: 0.31 [IU]/mL (ref 0.30–0.70)

## 2016-06-28 LAB — BASIC METABOLIC PANEL
ANION GAP: 7 (ref 5–15)
BUN: 18 mg/dL (ref 6–20)
CALCIUM: 8.4 mg/dL — AB (ref 8.9–10.3)
CO2: 22 mmol/L (ref 22–32)
CREATININE: 0.75 mg/dL (ref 0.61–1.24)
Chloride: 119 mmol/L — ABNORMAL HIGH (ref 101–111)
Glucose, Bld: 219 mg/dL — ABNORMAL HIGH (ref 65–99)
Potassium: 4 mmol/L (ref 3.5–5.1)
SODIUM: 148 mmol/L — AB (ref 135–145)

## 2016-06-28 LAB — CBC
HCT: 41.9 % (ref 39.0–52.0)
Hemoglobin: 13.4 g/dL (ref 13.0–17.0)
MCH: 30 pg (ref 26.0–34.0)
MCHC: 32 g/dL (ref 30.0–36.0)
MCV: 93.9 fL (ref 78.0–100.0)
Platelets: 168 10*3/uL (ref 150–400)
RBC: 4.46 MIL/uL (ref 4.22–5.81)
RDW: 15.6 % — AB (ref 11.5–15.5)
WBC: 6.5 10*3/uL (ref 4.0–10.5)

## 2016-06-28 MED ORDER — METOPROLOL TARTRATE 25 MG/10 ML ORAL SUSPENSION
6.2500 mg | Freq: Four times a day (QID) | ORAL | Status: DC
  Administered 2016-06-28 – 2016-06-29 (×4): 6.25 mg via ORAL
  Filled 2016-06-28 (×7): qty 5

## 2016-06-28 MED ORDER — METOPROLOL TARTRATE 25 MG/10 ML ORAL SUSPENSION
6.2500 mg | Freq: Four times a day (QID) | ORAL | Status: DC
Start: 2016-06-28 — End: 2016-06-28

## 2016-06-28 MED ORDER — APIXABAN 5 MG PO TABS
5.0000 mg | ORAL_TABLET | Freq: Two times a day (BID) | ORAL | Status: DC
  Administered 2016-06-28 – 2016-06-29 (×3): 5 mg via ORAL
  Filled 2016-06-28 (×3): qty 1

## 2016-06-28 MED ORDER — METOPROLOL TARTRATE 12.5 MG HALF TABLET: 6.2500 mg | ORAL_TABLET | Freq: Four times a day (QID) | ORAL | Status: DC

## 2016-06-28 MED ORDER — ATENOLOL 25 MG PO TABS: 25.0000 mg | ORAL_TABLET | Freq: Three times a day (TID) | ORAL | Status: DC

## 2016-06-28 MED ORDER — LEVETIRACETAM 500 MG PO TABS
500.0000 mg | ORAL_TABLET | Freq: Two times a day (BID) | ORAL | Status: DC
  Administered 2016-06-28 – 2016-06-29 (×2): 500 mg via ORAL
  Filled 2016-06-28 (×2): qty 1

## 2016-06-28 NOTE — Progress Notes (Signed)
Patient's heparin discontinued.

## 2016-06-28 NOTE — Progress Notes (Signed)
Pt refused to draw blood, grabbing phlebotomist hands.

## 2016-06-28 NOTE — NC FL2 (Signed)
[place] MEDICAID [id number] LEVEL OF CARE SCREENING TOOL     IDENTIFICATION  Patient Name: Barry Jones Birthdate: 07/30/1931 Sex: male Admission Date (Current Location): 06/21/2016  [facility] and [place] Number:  [employer] and Address:  [facility] [place]. [facility], [address], [place], [place] [address]      Provider Number: [id number]  Attending Physician Name and Address:  Maye Hides, MD  Relative Name and Phone Number:  Anola Gurney [phone]    Current Level of Care: Hospital Recommended Level of Care: Skilled Nursing Facility Prior Approval Number:    Date Approved/Denied:   PASRR Number: [id number]  Discharge Plan: SNF    Current Diagnoses: Patient Active Problem List   Diagnosis Date Noted  . Dysphagia 06/23/2016  . Dysphagia, oropharyngeal phase   . CVA (cerebral vascular accident) (HCC) 06/21/2016  . Dehydration 06/21/2016  . Acute encephalopathy 06/21/2016  . Atrial fibrillation (HCC) 04/21/2016  . Dementia 04/21/2016  . Hyperlipidemia LDL goal <70 04/21/2016  . Diabetes mellitus type II, controlled (HCC) 04/21/2016  . Anterior cerebral circulation hemorrhagic infarction (HCC) 04/21/2016  . 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 Essential (primary) hypertension 09/10/2015  . Local edema 09/10/2015  . Supraventricular tachycardia (HCC) 09/10/2015    Orientation RESPIRATION BLADDER Height & Weight     Self  Normal Incontinent Weight: 155 lb 3.3 oz (70.4 kg) Height:  5\' 9"  (175.3 cm)  BEHAVIORAL SYMPTOMS/MOOD NEUROLOGICAL BOWEL NUTRITION STATUS      Incontinent    AMBULATORY STATUS COMMUNICATION OF NEEDS Skin   Extensive Assist Non-Verbally Other (Comment) (Dry)                       Personal Care Assistance Level of Assistance  Bathing, Dressing Bathing Assistance: Limited assistance   Dressing Assistance: Limited assistance Total Care Assistance: Limited assistance    Functional Limitations Info  Hearing   Hearing Info: Impaired      SPECIAL CARE FACTORS FREQUENCY                       Contractures      Additional Factors Info  Code Status (Full Code)               Current Medications (06/28/2016):  This is the current hospital active medication list Current Facility-Administered Medications  Medication Dose Route Frequency Provider Last Rate Last Dose  .  stroke: mapping our early stages of recovery book   Does not apply Once Clydie Braun, MD      . apixaban (ELIQUIS) tablet 5 mg  5 mg Oral BID Maye Hides, MD   5 mg at 06/28/16 1058  . atorvastatin (LIPITOR) tablet 20 mg  20 mg Oral q1800 Clydie Braun, MD   20 mg at 06/27/16 1705  . chlorhexidine (PERIDEX) 0.12 % solution 15 mL  15 mL Mouth Rinse BID Catarina Hartshorn, MD   15 mL at 06/28/16 0849  . haloperidol lactate (HALDOL) injection 5 mg  5 mg Intravenous Q6H PRN Clydie Braun, MD   5 mg at 06/23/16 0043  . insulin aspart (novoLOG) injection 0-9 Units  0-9 Units Subcutaneous TID WC Clydie Braun, MD   5 Units at 06/28/16 1214  . levETIRAcetam (KEPPRA) tablet 500 mg  500 mg Oral BID Maye Hides, MD      . LORazepam (ATIVAN) injection 1 mg  1 mg  Intravenous Once Versie Starksimothy James Oster, MD      . LORazepam (ATIVAN) injection 1-2 mg  1-2 mg Intravenous BID PRN Clydie Braunondell A Smith, MD      . MEDLINE mouth rinse  15 mL Mouth Rinse q12n4p Catarina Hartshornavid Tat, MD   15 mL at 06/28/16 1058  . metoprolol tartrate (LOPRESSOR) 25 mg/10 mL oral suspension 6.25 mg  6.25 mg Oral Q6H Maye HidesAlexandria M Ukleja, MD      . potassium chloride (KLOR-CON) packet 20 mEq  20 mEq Oral BID Maye HidesAlexandria M Ukleja, MD   20 mEq at 06/28/16 0849  . RESOURCE THICKENUP CLEAR   Oral PRN Maye HidesAlexandria M Ukleja, MD         Discharge Medications: Please see discharge summary for a list of discharge medications.  Relevant Imaging Results:  Relevant Lab Results:   Additional Information    BROWN, SHELBY B,  LCSWA

## 2016-06-28 NOTE — Clinical Social Work Note (Signed)
Clinical Social Work Assessment  Patient Details  Name: Barry Jones MRN: [id number] Date of Birth: Oct 09, 1930  Date of referral:  06/28/16               Reason for consult:  Facility Placement, Discharge Planning                Permission sought to share information with:  Facility Art therapist granted to share information::     Name::        Agency::  SNF's  Relationship::     Contact Information:     Housing/Transportation Living arrangements for the past 2 months:   ([name]) Source of Information:  Adult Children Barry Jones [phone]) Patient Interpreter Needed:  None Criminal Activity/Legal Involvement Pertinent to Current Situation/Hospitalization:  No - Comment as needed Significant Relationships:  Adult Children Barry Jones, Son [phone], Barry Jones, dtr [phone] and Barry Jones, son [phone]) Lives with:  Self (Pt is a widow) Do you feel safe going back to the place where you live?  No Need for family participation in patient care:  Yes (Comment) (Family is actively involved in pt's care.)  Care giving concerns:  Pt is a widow and has been living alone in a single family home in [place], [place]. Pt does not have anyone that can provide him with 24 hr care. The family is agreeable to pt going to SNF.   Social Worker assessment / plan:   CSW met with pt and pt's son Barry Jones @ bedside to discuss physical therapist recommendation for SNF. Pt not able to communicate with CSW, pt oriented to person only, however pt's son Barry Jones present feels SNF will be a good idea. Pt's son advised CSW that he would like to see pt go to [facility] or [facility], CSW advised of the process and that the facility will have to accept pt. Barry Jones informed CSW his sister Barry Jones is pt's medical POA, however they have discussed which facility will be more convenient for the family. CSW will communicate this information to Barry Jones and continue working  on a safe DC for pt.  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   Patient/Family's Understanding of and Emotional Response to Diagnosis, Current Treatment, and Prognosis:  The family is aware and understands pt's medical conditions and feels pt needs another level of care at this time.   Emotional Assessment Appearance:  Appears older than stated age Attitude/Demeanor/Rapport:   (Pleasant and cooperative) Affect (typically observed):  Accepting Orientation:  Oriented to Self Alcohol / Substance use:  Not Applicable Psych involvement (Current and /or in the community):  No (Comment)  Discharge Needs  Concerns to be addressed:  Care Coordination, Discharge Planning Concerns, Cognitive Concerns Readmission within the last 30 days:  No Current discharge risk:  Cognitively Impaired, Dependent with Mobility, Lives alone Barriers to Discharge:  No Barriers Identified   Barry Jones, [place], LCSWA 06/28/2016, 2:59 PM

## 2016-06-28 NOTE — Progress Notes (Signed)
PROGRESS NOTE    Barry Jones  JXB:[id number] DOB: 03-27-1931 DOA: 06/21/2016 PCP: Paulina Fusi, MD   Brief Narrative:  82 y.o.malewith medical history significant of CVA, HTN, DMII, afib on Eliquis, and dementia; who presents with confusion and gaze preference. His son reports that he was having trouble seeing what was on his plate while he was trying to eat lunch and then started staring off into space. His speech became slurred, and stopped speaking, and he developed a gaze preference. He was taken to the ED where CT was interpreted as showing an acute nonhemorrhagic infarct in the high right parietal lobe with stable left PCA territory encephalomalacia. The pt was transferred to [facility] where neurology was consulted. The patient was given fosphenytoin with resolution of the continuous seizure but he continued to have occasional brief focal seizures on the left. He was then loaded with Keppra 1500 mg IV.   He had as stroke in August 2017 that resulted in left-sided weakness. He recovered well, however, and received home PT/OT with minimal residual deficits. On review of the chart, he was admitted to [facility] from 8/12-8/15/17. He presented at the time with the acute onset of left hemiparesis and R gaze. He was given IV tPA with improvement in symptoms after infusion. Workup revealed scattered infarcts in the R MCA territory, the largest of which was in the R parietal lobe. He had some asymptomatic hemorrhagic transformation of his strokes after tPA. It was felt that his strokes were the result of atrial fibrillation for which he was not anticoagulated. At the time of discharge, it was recommended that he have a repeat CT of the head in three weeks and if no hemorrhage was seen that he be started on Eliquis for secondary stroke prevention.  Patient slowly became more alert and a repeat barium swallow was ordered on 10/20.  Patient still noted to be a high risk of aspiration and would be best  suited with a dysphagia 1 diet of pudding thick liquids.   Assessment & Plan:   Principal Problem:   Acute encephalopathy Active Problems:   Essential (primary) hypertension   Atrial fibrillation (HCC)   Dementia   Hyperlipidemia LDL goal <70   Diabetes mellitus type II, controlled (HCC)   CVA (cerebral vascular accident) (HCC)   Dehydration   Dysphagia   Dysphagia, oropharyngeal phase   Acute Encephalopathy -multifactorial including seizures and anticholinergic medications (given benadryl), ??new stroke -04/20/2016 serum B12 672 -Urinalysis negative for pyuria -04/20/2016 TSH 0.996 - Appears to be clearing - patient awake, conversive, following 2 step commands - repeat barium swallow still a high risk of aspiration - transition all IV medications to PO today  Complex partial status epilepticus -manifested bytonic-clonic activity of the left arm and leg with his head turned up and to the leftwitness by neurology--consistent with his recent right MCA territory stroke being the focus.  -AEDs per neurology--d/c dilantin due to interaction with apixaban, started keppra -EEG diffuse slowing - Keppra 500mg  BID transition to PO  Cerebrovascular disease--concerned about new stroke  -had a right MCA territory infarct in August 2017 that was felt to be cardioembolic in nature due to underlying atrial fibrillation -recently started on Eliquis for secondary prevention and this can be continued per neurology  -06/23/16 CT brain--2 small Right cerebellar infarcts since 05/12/16 - on heparin now 2/2 dysphagia - switched to apixaban  Hypernatremia - will stop IVF - repeat BMP in am  Dysphagia -failed MBS initially -repeat barium swallow -will  restart apixaban - Dysphagia 1 diet - will attempt to transition back to all medications PO  Goals of Care -son/family open to GOC discussion -consult palliative medicine -remain FULL CODE   LLL Infiltrate -will NOT start abx -no  fever, no leukocytosis, no hypoxia -monitor clinically  Dementia:  -His family describes what sounds like a fairly significant underlying dementia.  -places him at increased risk for delirium from all causes and could predict delayed and possibly incomplete recovery from the same. -Avoid CNS active medications, particularly benzodiazepines, opiates, and anything with strong anticholinergic properties -haldol prn  Chronic Atrial Fibrillation -rate controlled -continue apixaban when able to tolerate PO -change IV lopressor to atenolol -CHADSVASc = 7  Diabetes Mellitus type 2 -04/19/2016 and hemoglobin A1c 6.4 -NovoLog sliding scale -Allow for liberalglycemic control  Hyperlipidemia -Continue statin when able to take po  Dehydration - repeat BMP Cr 0.67  HTN -change atenolol to IV lopressor - will transition back when patient taking consistent PO and tolerating pills  Hypomagnesemia  -replete -Mg at 2.0  Hypokalemia - will give potassium liquid - repeat BMP in am  Disposition Plan: SNF; discussed with family, will decide what facility. Patient not ready for discharge yet- need to ensure able to tolerate PO and can transition his medications back to PO route likely tomorrow Family Communication: son bedside Code Status: FULL DVT Prophylaxis: apixaban    Consultants:   Neurology  Palliative Medicine  PT/OT   Procedures:   Barium swallow 10/20  Antimicrobials:   none    Subjective: Patient asleep at time of exam.  Son states a day ago he had a normal conversation with patient.  He ate well last night and ate well this morning.  Son mentions patient seems to have a hard time with staying awake at night. Mentions that when patient awakens there are moments of clarity.  Placement again discussed with patient.  Objective: Vitals:   06/27/16 1700 06/27/16 2059 06/28/16 0551 06/28/16 0936  BP: (!) 183/92 (!) 152/87 (!) 174/99 (!) 151/89    Pulse: 86 97 77 88  Resp: 20 18 18 18   Temp: 98.6 F (37 C) 98.7 F (37.1 C) 97.7 F (36.5 C) 98.7 F (37.1 C)  TempSrc: Oral Oral Oral Oral  SpO2: 97% 94% 96% 96%  Weight:      Height:        Intake/Output Summary (Last 24 hours) at 06/28/16 1109 Last data filed at 06/28/16 0552  Gross per 24 hour  Intake                0 ml  Output             1250 ml  Net            -1250 ml   Filed Weights   06/21/16 2100 06/22/16 0012  Weight: 70.2 kg (154 lb 12.2 oz) 70.4 kg (155 lb 3.3 oz)    Examination:  General exam: Appears calm and comfortable  Respiratory system: Clear to auscultation. Respiratory effort normal. Cardiovascular system: S1 & S2 heard, irregularly irregular rhythm. No JVD, murmurs, rubs, gallops or clicks. No pedal edema. Gastrointestinal system: Abdomen is nondistended, soft and nontender. No organomegaly or masses felt. Normal bowel sounds heard. Central nervous system: awake, alert, not oriented except to person. Could not do thorough evaluation of CN due to poor hearing Extremities: able to move arms and legs spontaneously- could lift legs up on command, flex and extend at ankle and grip  hands Skin: No rashes, lesions or ulcers Psychiatry: unable to assess as patient not participatory and was drowsy.     Data Reviewed: I have personally reviewed following labs and imaging studies  CBC:  Recent Labs Lab 06/22/16 0345 06/24/16 0440 06/25/16 0508 06/26/16 0214 06/27/16 0517 06/28/16 0912  WBC 7.1 8.6 8.7 7.7 6.5 6.5  NEUTROABS 4.8  --   --   --   --   --   HGB 13.8 13.8 12.8* 14.1 12.5* 13.4  HCT 42.1 41.7 39.0 42.8 38.3* 41.9  MCV 93.3 92.1 92.4 92.8 92.5 93.9  PLT 155 147* 152 178 162 168   Basic Metabolic Panel:  Recent Labs Lab 06/22/16 0345 06/23/16 0415 06/24/16 0440 06/27/16 1002 06/28/16 0912  NA 142 139 137 146* 148*  K 3.5 3.5 3.3* 3.0* 4.0  CL 101 101 102 116* 119*  CO2 30 25 24 24 22   GLUCOSE 121* 149* 146* 181* 219*  BUN  24* 14 14 19 18   CREATININE 0.98 0.94 0.86 0.67 0.75  CALCIUM 8.6* 8.2* 7.9* 8.3* 8.4*  MG  --  1.2* 2.0  --   --    GFR: Estimated Creatinine Clearance: 69.7 mL/min (by C-G formula based on SCr of 0.75 mg/dL). Liver Function Tests:  Recent Labs Lab 06/22/16 0345  ALBUMIN 3.5   No results for input(s): LIPASE, AMYLASE in the last 168 hours.  Recent Labs Lab 06/22/16 1707  AMMONIA 11   Coagulation Profile: No results for input(s): INR, PROTIME in the last 168 hours. Cardiac Enzymes: No results for input(s): CKTOTAL, CKMB, CKMBINDEX, TROPONINI in the last 168 hours. BNP (last 3 results) No results for input(s): PROBNP in the last 8760 hours. HbA1C: No results for input(s): HGBA1C in the last 72 hours. CBG:  Recent Labs Lab 06/27/16 0607 06/27/16 1106 06/27/16 1652 06/27/16 2147 06/28/16 0547  GLUCAP 155* 204* 220* 241* 189*   Lipid Profile: No results for input(s): CHOL, HDL, LDLCALC, TRIG, CHOLHDL, LDLDIRECT in the last 72 hours. Thyroid Function Tests: No results for input(s): TSH, T4TOTAL, FREET4, T3FREE, THYROIDAB in the last 72 hours. Anemia Panel: No results for input(s): VITAMINB12, FOLATE, FERRITIN, TIBC, IRON, RETICCTPCT in the last 72 hours. Sepsis Labs: No results for input(s): PROCALCITON, LATICACIDVEN in the last 168 hours.  No results found for this or any previous visit (from the past 240 hour(s)).       Radiology Studies: Dg Swallowing Func-speech Pathology  Result Date: 06/26/2016 Objective Swallowing Evaluation: Type of Study: MBS-Modified Barium Swallow Study Patient Details Name: Odies Desa MRN: [id number] Date of Birth: 1931/03/09 Today's Date: 06/26/2016 Time: SLP Start Time (ACUTE ONLY): 1345-SLP Stop Time (ACUTE ONLY): 1415 SLP Time Calculation (min) (ACUTE ONLY): 30 min Past Medical History: Past Medical History: Diagnosis Date . Chronic atrial fibrillation (HCC)  . Diabetes mellitus type II, controlled (HCC)  . HTN (hypertension)  .  Stroke [facility]) 04/2016 Past Surgical History: Past Surgical History: Procedure Laterality Date . HERNIA REPAIR   . PROSTATE SURGERY   HPI: Pt is an 82 y.o. male with PMH of CVA 04/2016, HTN, DMII, afib on Eliquis, and dementia; who presented to ED 10/15 with confusion and gaze preference to the R with left-side weakness. Head CT showed that 2 small cerebellar infarcts had occurred since 05/12/16, evolution of recent R parietal infarct. MBS showed moderate oral/ severe pharyngeal swallow with sensorimotor deficits, weak pharyngeal swallow/ significant residuals/ mild silent aspiration of liquid consistencies and secretions, NPO recommendations. CXR showed LLL PNA. Also  suspected partial seizures. Palliative meeting 10/19- "Family is requesting for repeat speech-language pathology evaluation. Detailed discussions with daughter Lynden AngCathy about comfort feedings. Discussed about medical recommendation being to not proceed with PEG tube placement. Discussed about careful hand assisted oral feeding would be most appropriate in patients with advanced dementia and recurrent strokes." Bedside swallow eval ordered. Subjective: pt awake in chair Assessment / Plan / Recommendation CHL IP CLINICAL IMPRESSIONS 06/26/2016 Therapy Diagnosis Moderate oral phase dysphagia;Severe pharyngeal phase dysphagia Clinical Impression Unfortunately the pt continues to present with a moderate oral/ severe pharyngeal dysphagia which is sensorimotor in nature, not much change from previous study on 10/17. Pt with reduced awareness of bolus, delayed oral transit, and premature spill to the level of the pyriform sinuses with both thin and nectar-thick liquids, all of which were silently aspirated either before or during the swallow given decreased base of tongue retraction and pharyngeal peristalsis. Pt also with significant post-swallow residuals in the vallecula, pyriform sinuses, and at the CP segment which were not sensed by the pt. The pt was most  successful with puree consistencies, likely due to the increased sensory input with a heavier bolus. Penetration/ aspiration was not observed with puree; however, there may still be a risk of aspiration of this consistency given pt's inconsistencies of bolus awareness. Of the liquid consistencies, the safest trials appeared to be nectar-thick by teaspoon, although the pt did have trace aspiration of this consistency x1 as well. Provided education to son who was present for the study. Aspiration risk continues to be high. Will defer diet order to MD. The safest diet consistency appears to be dysphagia 1 and pudding-thick liquids by teaspoon. If thinner liquid consistencies are desired, nectar-thick by teaspoon would be the recommendation. Pt may not have adequate nutrition/ hydration with this diet recommendation. Ensure that pt is seated upright during meals and cue to swallow 2 times to clear any residuals, minimize distractions. Would also recommend oral care before and after meals. Ice chips can be provided after thorough oral care if desired (no more than 5 at a time). Will continue to follow for continued education/ monitor tolerance of POs.  Impact on safety and function Severe aspiration risk   CHL IP TREATMENT RECOMMENDATION 06/26/2016 Treatment Recommendations Therapy as outlined in treatment plan below   Prognosis 06/26/2016 Prognosis for Safe Diet Advancement Fair Barriers to Reach Goals Severity of deficits;Cognitive deficits Barriers/Prognosis Comment -- CHL IP DIET RECOMMENDATION 06/26/2016 SLP Diet Recommendations Depending on pt/ family wishes the options are NPO;Ice chips PRN after oral care; Dysphagia 1 (Puree) solids;Pudding thick liquid;Other (Comment). If thinner liquid desired, may try nectar via tsp Liquid Administration via Spoon Medication Administration Crushed with puree Compensations Minimize environmental distractions;Slow rate;Small sips/bites;Multiple dry swallows after each bite/sip  Postural Changes Seated upright at 90 degrees;Remain semi-upright after after feeds/meals (Comment)   CHL IP OTHER RECOMMENDATIONS 06/26/2016 Recommended Consults -- Oral Care Recommendations Oral care before and after PO Other Recommendations Order thickener from pharmacy;Clarify dietary restrictions   CHL IP FOLLOW UP RECOMMENDATIONS 06/26/2016 Follow up Recommendations Home health SLP   CHL IP FREQUENCY AND DURATION 06/26/2016 Speech Therapy Frequency (ACUTE ONLY) min 2x/week Treatment Duration 2 weeks      CHL IP ORAL PHASE 06/26/2016 Oral Phase Impaired Oral - Pudding Teaspoon -- Oral - Pudding Cup -- Oral - Honey Teaspoon -- Oral - Honey Cup -- Oral - Nectar Teaspoon Weak lingual manipulation;Reduced posterior propulsion;Premature spillage Oral - Nectar Cup Weak lingual manipulation;Reduced posterior propulsion;Delayed oral transit;Premature spillage;Decreased bolus  cohesion Oral - Nectar Straw Weak lingual manipulation;Delayed oral transit;Premature spillage Oral - Thin Teaspoon Weak lingual manipulation;Reduced posterior propulsion;Decreased bolus cohesion;Premature spillage Oral - Thin Cup -- Oral - Thin Straw -- Oral - Puree Delayed oral transit;Premature spillage;Other (Comment) Oral - Mech Soft Delayed oral transit;Premature spillage;Weak lingual manipulation Oral - Regular -- Oral - Multi-Consistency -- Oral - Pill -- Oral Phase - Comment --  CHL IP PHARYNGEAL PHASE 06/26/2016 Pharyngeal Phase Impaired Pharyngeal- Pudding Teaspoon -- Pharyngeal -- Pharyngeal- Pudding Cup -- Pharyngeal -- Pharyngeal- Honey Teaspoon -- Pharyngeal -- Pharyngeal- Honey Cup -- Pharyngeal -- Pharyngeal- Nectar Teaspoon Delayed swallow initiation-pyriform sinuses;Reduced pharyngeal peristalsis;Reduced epiglottic inversion;Reduced tongue base retraction;Penetration/Aspiration during swallow;Pharyngeal residue - valleculae;Pharyngeal residue - cp segment;Trace aspiration Pharyngeal Material enters airway, passes BELOW cords  without attempt by patient to eject out (silent aspiration) Pharyngeal- Nectar Cup Delayed swallow initiation-pyriform sinuses;Reduced pharyngeal peristalsis;Reduced epiglottic inversion;Reduced tongue base retraction;Penetration/Aspiration before swallow;Penetration/Aspiration during swallow;Moderate aspiration;Pharyngeal residue - valleculae Pharyngeal -- Pharyngeal- Nectar Straw Delayed swallow initiation-pyriform sinuses;Reduced tongue base retraction;Reduced pharyngeal peristalsis;Reduced epiglottic inversion;Penetration/Aspiration before swallow;Trace aspiration;Pharyngeal residue - valleculae;Pharyngeal residue - cp segment Pharyngeal Material enters airway, passes BELOW cords without attempt by patient to eject out (silent aspiration) Pharyngeal- Thin Teaspoon Delayed swallow initiation-pyriform sinuses;Reduced pharyngeal peristalsis;Reduced epiglottic inversion;Reduced laryngeal elevation;Penetration/Aspiration before swallow;Trace aspiration;Pharyngeal residue - valleculae Pharyngeal Material enters airway, passes BELOW cords without attempt by patient to eject out (silent aspiration) Pharyngeal- Thin Cup -- Pharyngeal -- Pharyngeal- Thin Straw -- Pharyngeal -- Pharyngeal- Puree Delayed swallow initiation-vallecula;Pharyngeal residue - valleculae Pharyngeal -- Pharyngeal- Mechanical Soft Other (Comment) Pharyngeal -- Pharyngeal- Regular -- Pharyngeal -- Pharyngeal- Multi-consistency -- Pharyngeal -- Pharyngeal- Pill -- Pharyngeal -- Pharyngeal Comment --  CHL IP CERVICAL ESOPHAGEAL PHASE 06/26/2016 Cervical Esophageal Phase Impaired Pudding Teaspoon -- Pudding Cup -- Honey Teaspoon -- Honey Cup -- Nectar Teaspoon Reduced cricopharyngeal relaxation Nectar Cup Reduced cricopharyngeal relaxation Nectar Straw Reduced cricopharyngeal relaxation Thin Teaspoon Reduced cricopharyngeal relaxation Thin Cup -- Thin Straw -- Puree Reduced cricopharyngeal relaxation Mechanical Soft Reduced cricopharyngeal relaxation  Regular -- Multi-consistency -- Pill -- Cervical Esophageal Comment -- No flowsheet data found. Metro Kung, MA, CCC-SLP 06/26/2016, 3:10 PM [phone]                  Scheduled Meds: .  stroke: mapping our early stages of recovery book   Does not apply Once  . apixaban  5 mg Oral BID  . atenolol  25 mg Oral TID  . atorvastatin  20 mg Oral q1800  . chlorhexidine  15 mL Mouth Rinse BID  . insulin aspart  0-9 Units Subcutaneous TID WC  . levETIRAcetam  500 mg Intravenous Q12H  . LORazepam  1 mg Intravenous Once  . mouth rinse  15 mL Mouth Rinse q12n4p  . potassium chloride  20 mEq Oral BID   Continuous Infusions:     LOS: 7 days    Time spent: 30 minutes    Bennett Scrape, MD [facility] Pager [phone]  If 7PM-7AM, please contact night-coverage [email or web address] Password [email or web address] 06/28/2016, 11:09 AM

## 2016-06-29 DIAGNOSIS — Z79899 Other long term (current) drug therapy: Secondary | ICD-10-CM | POA: Diagnosis not present

## 2016-06-29 DIAGNOSIS — Z130 Encounter for screening for diseases of the blood and blood-forming organs and certain disorders involving the immune mechanism: Secondary | ICD-10-CM | POA: Diagnosis not present

## 2016-06-29 DIAGNOSIS — R1312 Dysphagia, oropharyngeal phase: Secondary | ICD-10-CM | POA: Diagnosis not present

## 2016-06-29 DIAGNOSIS — Z741 Need for assistance with personal care: Secondary | ICD-10-CM | POA: Diagnosis not present

## 2016-06-29 DIAGNOSIS — R1310 Dysphagia, unspecified: Secondary | ICD-10-CM | POA: Diagnosis not present

## 2016-06-29 DIAGNOSIS — G4700 Insomnia, unspecified: Secondary | ICD-10-CM | POA: Diagnosis not present

## 2016-06-29 DIAGNOSIS — E785 Hyperlipidemia, unspecified: Secondary | ICD-10-CM | POA: Diagnosis not present

## 2016-06-29 DIAGNOSIS — G9340 Encephalopathy, unspecified: Secondary | ICD-10-CM | POA: Diagnosis not present

## 2016-06-29 DIAGNOSIS — E119 Type 2 diabetes mellitus without complications: Secondary | ICD-10-CM | POA: Diagnosis not present

## 2016-06-29 DIAGNOSIS — E7800 Pure hypercholesterolemia, unspecified: Secondary | ICD-10-CM | POA: Diagnosis not present

## 2016-06-29 DIAGNOSIS — I69320 Aphasia following cerebral infarction: Secondary | ICD-10-CM | POA: Diagnosis not present

## 2016-06-29 DIAGNOSIS — B353 Tinea pedis: Secondary | ICD-10-CM | POA: Diagnosis not present

## 2016-06-29 DIAGNOSIS — R488 Other symbolic dysfunctions: Secondary | ICD-10-CM | POA: Diagnosis not present

## 2016-06-29 DIAGNOSIS — E860 Dehydration: Secondary | ICD-10-CM | POA: Diagnosis not present

## 2016-06-29 DIAGNOSIS — I10 Essential (primary) hypertension: Secondary | ICD-10-CM | POA: Diagnosis not present

## 2016-06-29 DIAGNOSIS — E1129 Type 2 diabetes mellitus with other diabetic kidney complication: Secondary | ICD-10-CM | POA: Diagnosis not present

## 2016-06-29 DIAGNOSIS — R21 Rash and other nonspecific skin eruption: Secondary | ICD-10-CM | POA: Diagnosis not present

## 2016-06-29 DIAGNOSIS — I638 Other cerebral infarction: Secondary | ICD-10-CM | POA: Diagnosis not present

## 2016-06-29 DIAGNOSIS — I639 Cerebral infarction, unspecified: Secondary | ICD-10-CM | POA: Diagnosis not present

## 2016-06-29 DIAGNOSIS — R2689 Other abnormalities of gait and mobility: Secondary | ICD-10-CM | POA: Diagnosis not present

## 2016-06-29 DIAGNOSIS — M6281 Muscle weakness (generalized): Secondary | ICD-10-CM | POA: Diagnosis not present

## 2016-06-29 DIAGNOSIS — I4891 Unspecified atrial fibrillation: Secondary | ICD-10-CM | POA: Diagnosis not present

## 2016-06-29 DIAGNOSIS — I481 Persistent atrial fibrillation: Secondary | ICD-10-CM | POA: Diagnosis not present

## 2016-06-29 DIAGNOSIS — R531 Weakness: Secondary | ICD-10-CM | POA: Diagnosis not present

## 2016-06-29 DIAGNOSIS — I69859 Hemiplegia and hemiparesis following other cerebrovascular disease affecting unspecified side: Secondary | ICD-10-CM | POA: Diagnosis not present

## 2016-06-29 DIAGNOSIS — R262 Difficulty in walking, not elsewhere classified: Secondary | ICD-10-CM | POA: Diagnosis not present

## 2016-06-29 DIAGNOSIS — R279 Unspecified lack of coordination: Secondary | ICD-10-CM | POA: Diagnosis not present

## 2016-06-29 DIAGNOSIS — I69991 Dysphagia following unspecified cerebrovascular disease: Secondary | ICD-10-CM | POA: Diagnosis not present

## 2016-06-29 DIAGNOSIS — N390 Urinary tract infection, site not specified: Secondary | ICD-10-CM | POA: Diagnosis not present

## 2016-06-29 LAB — GLUCOSE, CAPILLARY
GLUCOSE-CAPILLARY: 161 mg/dL — AB (ref 65–99)
Glucose-Capillary: 260 mg/dL — ABNORMAL HIGH (ref 65–99)

## 2016-06-29 LAB — CBC
HEMATOCRIT: 38.2 % — AB (ref 39.0–52.0)
HEMOGLOBIN: 12.3 g/dL — AB (ref 13.0–17.0)
MCH: 30.3 pg (ref 26.0–34.0)
MCHC: 32.2 g/dL (ref 30.0–36.0)
MCV: 94.1 fL (ref 78.0–100.0)
Platelets: 171 10*3/uL (ref 150–400)
RBC: 4.06 MIL/uL — ABNORMAL LOW (ref 4.22–5.81)
RDW: 15.6 % — ABNORMAL HIGH (ref 11.5–15.5)
WBC: 5.9 10*3/uL (ref 4.0–10.5)

## 2016-06-29 MED ORDER — LEVETIRACETAM 500 MG PO TABS: 500.0000 mg | ORAL_TABLET | Freq: Two times a day (BID) | ORAL | Status: DC

## 2016-06-29 MED ORDER — METOPROLOL TARTRATE 25 MG/10 ML ORAL SUSPENSION: 6.2500 mg | Freq: Four times a day (QID) | ORAL | Status: AC

## 2016-06-29 NOTE — Progress Notes (Signed)
Speech Language Pathology Treatment: Dysphagia  Patient Details Name: Barry GashDallas Jones MRN: [id number] DOB: 11/27/1930 Today's Date: 06/29/2016 Time: [id number] SLP Time Calculation (min) (ACUTE ONLY): 19 min  Assessment / Plan / Recommendation Clinical Impression  Skilled observation with Dysphagia 1 (puree)/pudding thick-liquids via tsp amounts with eventual delayed throat clear and wet vocal quality; effortful swallow/repetitive swallow instituted during po feeding and this improved vocal quality overall; family educated re: aspiration risk/swallowing precautions needed during intake of current diet.  Nursing stated medication administration without s/s of aspiration in a.m. With whole medications in puree and liquid medication thickened to pudding thick without difficulty; pt's swallowing speed appears improved overall, but decreased oral propulsion noted without total (visual/verbal/tactile) cueing provided by SLP during skilled observation. Recommend to con't Dysphagia 1/pudding-thickened liquids at this time with swallowing precautions in place. ST will con't to follow while in house.   HPI HPI: Pt is an 82 y.o. male with PMH of CVA 04/2016, HTN, DMII, afib on Eliquis, and dementia; who presented to ED 10/15 with confusion and gaze preference to the R with left-side weakness. Head CT showed that 2 small cerebellar infarcts had occurred since 05/12/16, evolution of recent R parietal infarct. MBS showed moderate oral/ severe pharyngeal swallow with sensorimotor deficits, weak pharyngeal swallow/ significant residuals/ mild silent aspiration of liquid consistencies and secretions, NPO recommendations. CXR showed LLL PNA. Also suspected partial seizures. Palliative meeting 10/19- "Family is requesting for repeat speech-language pathology evaluation. Detailed discussions with daughter Lynden AngCathy about comfort feedings. Discussed about medical recommendation being to not proceed with PEG tube placement. Discussed  about careful hand assisted oral feeding would be most appropriate in patients with advanced dementia and recurrent strokes." Bedside swallow eval ordered.      SLP Plan  Continue with current plan of care     Recommendations  Diet recommendations: Dysphagia 1 (puree);Pudding-thick liquid Liquids provided via: Teaspoon Medication Administration: Crushed with puree Supervision: Full supervision/cueing for compensatory strategies;Trained caregiver to feed patient Compensations: Minimize environmental distractions;Slow rate;Small sips/bites;Multiple dry swallows after each bite/sip Postural Changes and/or Swallow Maneuvers: Seated upright 90 degrees                Oral Care Recommendations: Oral care QID Follow up Recommendations: Skilled Nursing facility Plan: Continue with current plan of care                       Chanan Detwiler,PAT, M.S., CCC-SLP 06/29/2016, 2:29 PM

## 2016-06-29 NOTE — Progress Notes (Signed)
Physical Therapy Treatment Patient Details Name: Barry GashDallas Felten MRN: [id number] DOB: 04/13/1931 Today's Date: 06/29/2016    History of Present Illness Pt is a 82 yo male admitted after pt starting having trouble seeing lunch plate, developed slurred speech, R gaze preference and had tonic/clonic sz activity in the L sdie.  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 Pt was admitted in 8/17 with similar symptoms, was given TPA and [facility]. Pt with h/o afib, significant dementia, DMII.    PT Comments    Patient appeared weaker this session and less alert this am. +2 assist required for all mobility. Pt with L side gaze preference which is opposite of last week. Granddaughter present during session. Current plan remains appropriate.   Follow Up Recommendations  SNF;Supervision/Assistance - 24 hour     Equipment Recommendations  None recommended by PT    Recommendations for Other Services       Precautions / Restrictions Precautions Precautions: Fall Precaution Comments: seizures Restrictions Weight Bearing Restrictions: No    Mobility  Bed Mobility Overal bed mobility: Needs Assistance;+2 for physical assistance Bed Mobility: Supine to Sit     Supine to sit: Max assist;+2 for physical assistance     General bed mobility comments: pt assisted very minimally with transfer; assist at bilat LE and trunk to come into sitting; use of bed pad to scoot hips to EOB; pt with heavy L lateral lean and needed hand over hand assist to use bilat UE to help maintain sitting balance  Transfers Overall transfer level: Needs assistance Equipment used: Rolling walker (2 wheeled) Transfers: Sit to/from [employer];Stand Pivot Transfers Sit to Stand: Max assist;+2 physical assistance;From elevated surface Stand pivot transfers: Total assist;+2 physical assistance       General transfer comment: used Stedy for transfers; multimodal cues for hand/foot placement with max A to  position prior to transfer; max A +2 to stand X2 from [place] and total A +2 to go from EOB to recliner; pt with L lateral lean and assistance required to maintain sitting balance during transfer to recliner  Ambulation/Gait             General Gait Details: unable   Stairs            Wheelchair Mobility    Modified Rankin (Stroke Patients Only)       Balance     Sitting balance-Leahy Scale: Poor Sitting balance - Comments: posterior lean and L lateral lean at times with max A required      Standing balance-Leahy Scale: Zero                      Cognition Arousal/Alertness: Awake/alert Behavior During Therapy: WFL for tasks assessed/performed Overall Cognitive Status: Impaired/Different from baseline Area of Impairment: Orientation;Attention;Memory;Following commands;Safety/judgement;Awareness;Problem solving Orientation Level: Disoriented to;Time;Situation;Place Current Attention Level: Focused Memory: Decreased recall of precautions;Decreased short-term memory Following Commands: Follows one step commands inconsistently;Follows one step commands with increased time Safety/Judgement: Decreased awareness of safety;Decreased awareness of deficits Awareness: Intellectual Problem Solving: Slow processing;Decreased initiation;Requires verbal cues;Requires tactile cues General Comments: pt HOH; pt less responsive and alert this session    Exercises      General Comments General comments (skin integrity, edema, etc.): pt with L side gaze preference this session which is opposite of last week; pt had no c/o pain when head turned toward R side by therapist      Pertinent Vitals/Pain Pain Assessment: Faces Faces Pain Scale:  Hurts little more Pain Location: head and back Pain Descriptors / Indicators: Aching;Headache;Sore    Home Living                      Prior Function            PT Goals (current goals can now be found in the care plan  section) Progress towards PT goals: Not progressing toward goals - comment    Frequency    Min 4X/week      PT Plan Current plan remains appropriate    Co-evaluation             End of Session Equipment Utilized During Treatment: Gait belt Activity Tolerance: Patient tolerated treatment well Patient left: in chair;with call bell/phone within reach;with chair alarm set;with family/visitor present     Time: 1052-1110 PT Time Calculation (min) (ACUTE ONLY): 18 min  Charges:  $Therapeutic Activity: 8-22 mins                    G Codes:      Derek Mound, PTA Pager: [phone]   06/29/2016, 11:38 AM

## 2016-06-29 NOTE — Care Management Note (Signed)
Case Management Note  Patient Details  Name: Barry GashDallas Jones MRN: [id number] Date of Birth: 01/19/1931  Subjective/Objective:                    Action/Plan: [facility]. No further needs per CM.   Expected Discharge Date:                  Expected Discharge Plan:  Skilled Nursing Facility  In-House Referral:  Clinical Social Work  Discharge planning Services  CM Consult  Post Acute Care Choice:    Choice offered to:     DME Arranged:    DME Agency:     HH Arranged:    HH Agency:     Status of Service:  Completed, signed off  If discussed at [employer] Meetings, dates discussed:    Additional Comments:  Kermit BaloKelli F Demaree Liberto, RN 06/29/2016, 11:39 AM

## 2016-06-29 NOTE — Clinical Social Work Note (Signed)
Pt is ready for discharge today. CSW presented bed offers to daughter, and [facility] CSW updated facility. Facility is ready to admit pt as they have received discharge information. RN will call report. [facility] will provide transportation. CSW is signing off as no further needs identified.   Dede QuerySarah Jamesetta Greenhalgh, MSW, LCSW  Clinical Social Worker  [phone]

## 2016-06-29 NOTE — Progress Notes (Signed)
Occupational Therapy Treatment Patient Details Name: Barry GashDallas Jones MRN: [id number] DOB: 11/02/1930 Today's Date: 06/29/2016    History of present illness Pt is a 82 yo male admitted after pt starting having trouble seeing lunch plate, developed slurred speech, R gaze preference and had tonic/clonic sz activity in the L sdie.  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 Pt was admitted in 8/17 with similar symptoms, was given TPA and returned home with son and [facility] services. Pt with h/o afib, significant dementia, DMII.   OT comments  Pt. Was sitting in recliner chair leaning to L. Pt. Required Mod A to come to midlline and required Min to Mod a to sustain midline. Pt. Worked on forward flexion and maintaining midline and requried Min A to mperform task. Pt. Was Min A to rotate to R and weight shift to R. Pt. Granddaughter states that his decrased sitting balance is new. Pt. Was confused and was speaking nonsensically. Pt. Was Mod A to wash hands and face with max cues. Pt. Was Mod/Max a with brushing hair.   Follow Up Recommendations  SNF    Equipment Recommendations       Recommendations for Other Services      Precautions / Restrictions Precautions Precautions: Fall Precaution Comments: seizures Restrictions Weight Bearing Restrictions: No       Mobility Bed Mobility Overal bed mobility: Needs Assistance;+2 for physical assistance Bed Mobility: Supine to Sit     Supine to sit: Max assist;+2 for physical assistance     General bed mobility comments: pt assisted very minimally with transfer; assist at bilat LE and trunk to come into sitting; use of bed pad to scoot hips to EOB; pt with heavy L lateral lean and needed hand over hand assist to use bilat UE to help maintain sitting balance  Transfers Overall transfer level: Needs assistance Equipment used: Rolling walker (2 wheeled) Transfers: Sit to/from [employer];Stand Pivot Transfers Sit to Stand: Max assist;+2 physical assistance;From  elevated surface Stand pivot transfers: Total assist;+2 physical assistance       General transfer comment: used Stedy for transfers; multimodal cues for hand/foot placement with max A to position prior to transfer; max A +2 to stand X2 from [place] and total A +2 to go from EOB to recliner; pt with L lateral lean and assistance required to maintain sitting balance during transfer to recliner    Balance     Sitting balance-Leahy Scale: Poor Sitting balance - Comments: Pt. was leaning to L and posteror. Pt. grandaughter states that this is new.      Standing balance-Leahy Scale: Zero                     ADL                                                Vision                     Perception     Praxis      Cognition   Behavior During Therapy: WFL for tasks assessed/performed Overall Cognitive Status: Impaired/Different from baseline Area of Impairment: Orientation;Attention;Memory;Following commands;Safety/judgement;Awareness;Problem solving Orientation Level: Disoriented to;Time;Situation;Place Current Attention Level: Focused Memory: Decreased recall of precautions;Decreased short-term memory  Following Commands: Follows one step commands inconsistently;Follows one step commands with increased time Safety/Judgement: Decreased awareness of  safety;Decreased awareness of deficits Awareness: Intellectual Problem Solving: Slow processing;Decreased initiation;Requires verbal cues;Requires tactile cues General Comments: Pt. is HOH but was able to follow directions with cues.     Extremity/Trunk Assessment               Exercises     Shoulder Instructions       General Comments      Pertinent Vitals/ Pain       Pain Assessment: No/denies pain Faces Pain Scale: Hurts little more Pain Location: head and back Pain Descriptors / Indicators: Aching;Headache;Sore  Home Living Family/patient expects to be discharged to:: Skilled  nursing facility                                        Prior Functioning/Environment              Frequency           Progress Toward Goals  OT Goals(current goals can now be found in the care plan section)  Progress towards OT goals: Progressing toward goals     Plan Discharge plan remains appropriate    Co-evaluation                 End of Session     Activity Tolerance Patient tolerated treatment well   Patient Left in chair;with call bell/phone within reach;with family/visitor present   Nurse Communication          Time: 1610-9604 OT Time Calculation (min): 29 min  Charges: OT General Charges $OT Visit: 1 Procedure OT Treatments $Self Care/Home Management : 8-22 mins $Neuromuscular Re-education: 8-22 mins  Barry Jones 06/29/2016, 1:45 PM

## 2016-06-29 NOTE — Discharge Summary (Addendum)
Physician Discharge Summary  Barry Jones ZOX:[id number] DOB: November 20, 1930 DOA: 06/21/2016  PCP: Paulina Fusi, MD  Admit date: 06/21/2016 Discharge date: 06/29/2016  Admitted From: Home Disposition:  SNF [facility]  Recommendations for Outpatient Follow-up:  1. Follow up with PCP in 1-2 weeks 2. Please obtain BMP/CBC in two days 3. Dysphagia diet as ordered 4. Please take medications as prescribed  Home Health: No Equipment/Devices: Walker, oxygen  Discharge Condition:Stable CODE STATUS:Full Code Diet recommendation: Carb Modified Dysphagia diet  Brief/Interim Summary: 82 y.o.malewith medical history significant of CVA, HTN, DMII, afib on Eliquis, and dementia; who presents with confusion and gaze preference. His son reports that he was having trouble seeing what was on his plate while he was trying to eat lunch and then started staring off into space. His speech became slurred, and stopped speaking, and he developed a gaze preference. He was taken to the ED where CT was interpreted as showing an acute nonhemorrhagic infarct in the high right parietal lobe with stable left PCA territory encephalomalacia. The pt was transferred to [facility] where neurology was consulted. The patient was given fosphenytoin with resolution of the continuous seizure but he continued to have occasional brief focal seizures on the left. He was then loaded with Keppra 1500 mg IV.   He had as stroke in August 2017 that resulted in left-sided weakness. He recovered well, however, and received home PT/OT with minimal residual deficits. On review of the chart, he was admitted to [facility] from 8/12-8/15/17. He presented at the time with the acute onset of left hemiparesis and R gaze. He was given IV tPA with improvement in symptoms after infusion. Workup revealed scattered infarcts in the R MCA territory, the largest of which was in the R parietal lobe. He had some asymptomatic hemorrhagic transformation of his  strokes after tPA. It was felt that his strokes were the result of atrial fibrillation for which he was not anticoagulated. At the time of discharge, it was recommended that he have a repeat CT of the head in three weeks and if no hemorrhage was seen that he be started on Eliquis for secondary stroke prevention.  Patient slowly became more alert and a repeat barium swallow was ordered on 10/20.  Patient still noted to be a high risk of aspiration and would be best suited with a dysphagia 1 diet of pudding thick liquids.  Progressed and medications were transitioned to PO and IV medication discontinued.  Will discharge to [facility].  Discharge Diagnoses:  Principal Problem:   Acute encephalopathy Active Problems:   Essential (primary) hypertension   Atrial fibrillation (HCC)   Dementia   Hyperlipidemia LDL goal <70   Diabetes mellitus type II, controlled (HCC)   CVA (cerebral vascular accident) (HCC)   Dehydration   Dysphagia   Dysphagia, oropharyngeal phase    Discharge Instructions  Discharge Instructions    Call MD for:  difficulty breathing, headache or visual disturbances    Complete by:  As directed    Call MD for:  extreme fatigue    Complete by:  As directed    Call MD for:  persistant dizziness or light-headedness    Complete by:  As directed    Call MD for:  persistant nausea and vomiting    Complete by:  As directed    Call MD for:  severe uncontrolled pain    Complete by:  As directed    Call MD for:  temperature >100.4    Complete by:  As directed    Diet - low sodium heart healthy    Complete by:  As directed    Diet Carb Modified    Complete by:  As directed    Increase activity slowly    Complete by:  As directed        Medication List    STOP taking these medications   atenolol 25 MG tablet Commonly known as:  TENORMIN   MELATONIN PO     TAKE these medications   atorvastatin 20 MG tablet Commonly known as:  LIPITOR Take 1 tablet (20 mg total)  by mouth daily at 6 PM.   B-12 PO Take 1 tablet by mouth daily.   ELIQUIS 5 MG Tabs tablet Generic drug:  apixaban Take 5 mg by mouth daily.   furosemide 40 MG tablet Commonly known as:  LASIX Take 40 mg by mouth daily.   levETIRAcetam 500 MG tablet Commonly known as:  KEPPRA Take 1 tablet (500 mg total) by mouth 2 (two) times daily.   metFORMIN 500 MG tablet Commonly known as:  GLUCOPHAGE Take 500 mg by mouth 2 (two) times daily with a meal.   metoprolol tartrate 25 mg/10 mL Susp Commonly known as:  LOPRESSOR Take 2.5 mLs (6.25 mg total) by mouth every 6 (six) hours.   nitrofurantoin 100 MG capsule Commonly known as:  MACRODANTIN Take 100 mg by mouth daily.      Contact information for after-discharge care    Destination    [facility] .   Specialty:  Skilled Nursing Facility Contact information: [address] [place] [address] [phone]             Allergies  Allergen Reactions  . Ciprofloxacin Other (See Comments)    Made pt crazy    Consultations:  Neurology  PT/OT  SLP   Procedures/Studies: Ct Head Wo Contrast  Result Date: 06/23/2016 CLINICAL DATA:  Acute encephalopathy. EXAM: CT HEAD WITHOUT CONTRAST TECHNIQUE: Contiguous axial images were obtained from the base of the skull through the vertex without intravenous contrast. COMPARISON:  05/12/2016 FINDINGS: Brain: 2 small areas of low-density in the peripheral right cerebellum are new from 05/12/2016 head CT and brain MRI 04/19/2016. No interval supratentorial infarction. Expected evolution of right parietal infarct with resolved high-density and decreasing mass effect. Remote left inferior occipital infarct with dense gliosis. Atrophy with ventriculomegaly. Mild small vessel ischemic change in the cerebral white matter. Remote lacunar infarct in the left thalamus. Vascular: Atherosclerotic calcification. Skull: Negative Sinuses/Orbits: Bilateral cataract  resection.  No acute finding. IMPRESSION: 1. Two small right cerebellar infarcts have occurred since comparison 05/12/2016. 2. Expected evolution of recent right parietal infarct. Superimposed hemorrhage has resolved. 3. Remote left occipital infarct. 4. Atrophy and ventriculomegaly. Electronically Signed   By: Marnee Spring M.D.   On: 06/23/2016 12:27   Dg Chest Port 1 View  Result Date: 06/22/2016 CLINICAL DATA:  Shortness of breath. EXAM: PORTABLE CHEST 1 VIEW COMPARISON:  No prior. FINDINGS: Mediastinum and hilar structures are unremarkable. Cardiomegaly. Mild pulmonary vascular prominence. Low lung volumes. Left lower lobe infiltrate noted. No prominent pleural effusion or pneumothorax. IMPRESSION: 1. Left lower lobe infiltrate.  Low lung volumes. 2. Cardiomegaly with mild pulmonary vascular prominence. Electronically Signed   By: Maisie Fus  Register   On: 06/22/2016 06:48   Dg Swallowing Func-speech Pathology  Result Date: 06/26/2016 Objective Swallowing Evaluation: Type of Study: MBS-Modified Barium Swallow Study Patient Details Name: Barry Jones MRN: [id number] Date of  Birth: 10/01/1930 Today's Date: 06/26/2016 Time: SLP Start Time (ACUTE ONLY): 1345-SLP Stop Time (ACUTE ONLY): 1415 SLP Time Calculation (min) (ACUTE ONLY): 30 min Past Medical History: Past Medical History: Diagnosis Date . Chronic atrial fibrillation (HCC)  . Diabetes mellitus type II, controlled (HCC)  . HTN (hypertension)  . Stroke [facility](HCC) 04/2016 Past Surgical History: Past Surgical History: Procedure Laterality Date . HERNIA REPAIR   . PROSTATE SURGERY   HPI: Pt is an 82 y.o. male with PMH of CVA 04/2016, HTN, DMII, afib on Eliquis, and dementia; who presented to ED 10/15 with confusion and gaze preference to the R with left-side weakness. Head CT showed that 2 small cerebellar infarcts had occurred since 05/12/16, evolution of recent R parietal infarct. MBS showed moderate oral/ severe pharyngeal swallow with sensorimotor deficits,  weak pharyngeal swallow/ significant residuals/ mild silent aspiration of liquid consistencies and secretions, NPO recommendations. CXR showed LLL PNA. Also suspected partial seizures. Palliative meeting 10/19- "Family is requesting for repeat speech-language pathology evaluation. Detailed discussions with daughter Lynden AngCathy about comfort feedings. Discussed about medical recommendation being to not proceed with PEG tube placement. Discussed about careful hand assisted oral feeding would be most appropriate in patients with advanced dementia and recurrent strokes." Bedside swallow eval ordered. Subjective: pt awake in chair Assessment / Plan / Recommendation CHL IP CLINICAL IMPRESSIONS 06/26/2016 Therapy Diagnosis Moderate oral phase dysphagia;Severe pharyngeal phase dysphagia Clinical Impression Unfortunately the pt continues to present with a moderate oral/ severe pharyngeal dysphagia which is sensorimotor in nature, not much change from previous study on 10/17. Pt with reduced awareness of bolus, delayed oral transit, and premature spill to the level of the pyriform sinuses with both thin and nectar-thick liquids, all of which were silently aspirated either before or during the swallow given decreased base of tongue retraction and pharyngeal peristalsis. Pt also with significant post-swallow residuals in the vallecula, pyriform sinuses, and at the CP segment which were not sensed by the pt. The pt was most successful with puree consistencies, likely due to the increased sensory input with a heavier bolus. Penetration/ aspiration was not observed with puree; however, there may still be a risk of aspiration of this consistency given pt's inconsistencies of bolus awareness. Of the liquid consistencies, the safest trials appeared to be nectar-thick by teaspoon, although the pt did have trace aspiration of this consistency x1 as well. Provided education to son who was present for the study. Aspiration risk continues to be  high. Will defer diet order to MD. The safest diet consistency appears to be dysphagia 1 and pudding-thick liquids by teaspoon. If thinner liquid consistencies are desired, nectar-thick by teaspoon would be the recommendation. Pt may not have adequate nutrition/ hydration with this diet recommendation. Ensure that pt is seated upright during meals and cue to swallow 2 times to clear any residuals, minimize distractions. Would also recommend oral care before and after meals. Ice chips can be provided after thorough oral care if desired (no more than 5 at a time). Will continue to follow for continued education/ monitor tolerance of POs.  Impact on safety and function Severe aspiration risk   CHL IP TREATMENT RECOMMENDATION 06/26/2016 Treatment Recommendations Therapy as outlined in treatment plan below   Prognosis 06/26/2016 Prognosis for Safe Diet Advancement Fair Barriers to Reach Goals Severity of deficits;Cognitive deficits Barriers/Prognosis Comment -- CHL IP DIET RECOMMENDATION 06/26/2016 SLP Diet Recommendations Depending on pt/ family wishes the options are NPO;Ice chips PRN after oral care; Dysphagia 1 (Puree) solids;Pudding thick liquid;Other (Comment).  If thinner liquid desired, may try nectar via tsp Liquid Administration via Spoon Medication Administration Crushed with puree Compensations Minimize environmental distractions;Slow rate;Small sips/bites;Multiple dry swallows after each bite/sip Postural Changes Seated upright at 90 degrees;Remain semi-upright after after feeds/meals (Comment)   CHL IP OTHER RECOMMENDATIONS 06/26/2016 Recommended Consults -- Oral Care Recommendations Oral care before and after PO Other Recommendations Order thickener from pharmacy;Clarify dietary restrictions   CHL IP FOLLOW UP RECOMMENDATIONS 06/26/2016 Follow up Recommendations Home health SLP   CHL IP FREQUENCY AND DURATION 06/26/2016 Speech Therapy Frequency (ACUTE ONLY) min 2x/week Treatment Duration 2 weeks      CHL IP  ORAL PHASE 06/26/2016 Oral Phase Impaired Oral - Pudding Teaspoon -- Oral - Pudding Cup -- Oral - Honey Teaspoon -- Oral - Honey Cup -- Oral - Nectar Teaspoon Weak lingual manipulation;Reduced posterior propulsion;Premature spillage Oral - Nectar Cup Weak lingual manipulation;Reduced posterior propulsion;Delayed oral transit;Premature spillage;Decreased bolus cohesion Oral - Nectar Straw Weak lingual manipulation;Delayed oral transit;Premature spillage Oral - Thin Teaspoon Weak lingual manipulation;Reduced posterior propulsion;Decreased bolus cohesion;Premature spillage Oral - Thin Cup -- Oral - Thin Straw -- Oral - Puree Delayed oral transit;Premature spillage;Other (Comment) Oral - Mech Soft Delayed oral transit;Premature spillage;Weak lingual manipulation Oral - Regular -- Oral - Multi-Consistency -- Oral - Pill -- Oral Phase - Comment --  CHL IP PHARYNGEAL PHASE 06/26/2016 Pharyngeal Phase Impaired Pharyngeal- Pudding Teaspoon -- Pharyngeal -- Pharyngeal- Pudding Cup -- Pharyngeal -- Pharyngeal- Honey Teaspoon -- Pharyngeal -- Pharyngeal- Honey Cup -- Pharyngeal -- Pharyngeal- Nectar Teaspoon Delayed swallow initiation-pyriform sinuses;Reduced pharyngeal peristalsis;Reduced epiglottic inversion;Reduced tongue base retraction;Penetration/Aspiration during swallow;Pharyngeal residue - valleculae;Pharyngeal residue - cp segment;Trace aspiration Pharyngeal Material enters airway, passes BELOW cords without attempt by patient to eject out (silent aspiration) Pharyngeal- Nectar Cup Delayed swallow initiation-pyriform sinuses;Reduced pharyngeal peristalsis;Reduced epiglottic inversion;Reduced tongue base retraction;Penetration/Aspiration before swallow;Penetration/Aspiration during swallow;Moderate aspiration;Pharyngeal residue - valleculae Pharyngeal -- Pharyngeal- Nectar Straw Delayed swallow initiation-pyriform sinuses;Reduced tongue base retraction;Reduced pharyngeal peristalsis;Reduced epiglottic  inversion;Penetration/Aspiration before swallow;Trace aspiration;Pharyngeal residue - valleculae;Pharyngeal residue - cp segment Pharyngeal Material enters airway, passes BELOW cords without attempt by patient to eject out (silent aspiration) Pharyngeal- Thin Teaspoon Delayed swallow initiation-pyriform sinuses;Reduced pharyngeal peristalsis;Reduced epiglottic inversion;Reduced laryngeal elevation;Penetration/Aspiration before swallow;Trace aspiration;Pharyngeal residue - valleculae Pharyngeal Material enters airway, passes BELOW cords without attempt by patient to eject out (silent aspiration) Pharyngeal- Thin Cup -- Pharyngeal -- Pharyngeal- Thin Straw -- Pharyngeal -- Pharyngeal- Puree Delayed swallow initiation-vallecula;Pharyngeal residue - valleculae Pharyngeal -- Pharyngeal- Mechanical Soft Other (Comment) Pharyngeal -- Pharyngeal- Regular -- Pharyngeal -- Pharyngeal- Multi-consistency -- Pharyngeal -- Pharyngeal- Pill -- Pharyngeal -- Pharyngeal Comment --  CHL IP CERVICAL ESOPHAGEAL PHASE 06/26/2016 Cervical Esophageal Phase Impaired Pudding Teaspoon -- Pudding Cup -- Honey Teaspoon -- Honey Cup -- Nectar Teaspoon Reduced cricopharyngeal relaxation Nectar Cup Reduced cricopharyngeal relaxation Nectar Straw Reduced cricopharyngeal relaxation Thin Teaspoon Reduced cricopharyngeal relaxation Thin Cup -- Thin Straw -- Puree Reduced cricopharyngeal relaxation Mechanical Soft Reduced cricopharyngeal relaxation Regular -- Multi-consistency -- Pill -- Cervical Esophageal Comment -- No flowsheet data found. Metro Kung, MA, CCC-SLP 06/26/2016, 3:10 PM [phone]             Dg Swallowing Func-speech Pathology  Result Date: 06/23/2016 Objective Swallowing Evaluation: Type of Study: MBS-Modified Barium Swallow Study Patient Details Name: Barry Jones MRN: [id number] Date of Birth: Mar 06, 1931 Today's Date: 06/23/2016 Time: SLP Start Time (ACUTE ONLY): 0955-SLP Stop Time (ACUTE ONLY): 1025 SLP Time Calculation (min)  (ACUTE ONLY): 30 min Past Medical History: Past Medical History: Diagnosis Date .  Chronic atrial fibrillation (HCC)  . Diabetes mellitus type II, controlled (HCC)  . HTN (hypertension)  Past Surgical History: No past surgical history on file. HPI: Pt is a 82 yo male admitted after pt starting having trouble seeing lunch plate, developed slurred speech, R gaze preference and had tonic/clonic sz activity in the L side. Pt found to have right parietal CVA per CT HEAD SEPT 2017.    Pt for MRI today.  CXR showed LLL pna.  Pt was also admitted with similar symptoms 04/2016, was given TPA and returned home with son and [facility] services. Pt resides with son and son reports pt was only "milldy demented" prior to admission.  Pt with h/o afib, significant dementia, DMII.    Subjective: pt awake in chair Assessment / Plan / Recommendation CHL IP CLINICAL IMPRESSIONS 06/23/2016 Therapy Diagnosis Moderate oral phase dysphagia;Severe pharyngeal phase dysphagia Clinical Impression Moderate oral and severe pharyngeal dysphagia with sensorimotor deficits.  Oral weakness/discoordination results in delayed oral transiting, decreased oral bolus cohesion with premature spillage into pharynx.  Pharyngeal swallow was delayed and weak resulting in severe pharyngeal residuals WITHOUT pt awareness.  SlP used dry spoon stimulation to tongue to elicit swallow - but this did not clear residuals.  Mild aspiration observed with nectar, thin and secretions mixed with barium without pt attempt to cough to clear.  Postures attempted but unable to be performed due to pt's difficulty following directions.   Pt is at high aspiration risk with po; pending family decisions, would recommend npo except single tsps of water for comfort/oral hygeine.  Son Thayer Ohm present and SLP educated son/pt to findings/recommendations.  Advised son to cue pt to cough and swallow if he is throat clearing as this is indicative of aspiration.  Will follow for po readiness.   Impact  on safety and function Severe aspiration risk   CHL IP TREATMENT RECOMMENDATION 06/23/2016 Treatment Recommendations Therapy as outlined in treatment plan below   Prognosis 06/23/2016 Prognosis for Safe Diet Advancement Fair Barriers to Reach Goals Severity of deficits;Cognitive deficits;Other (Comment) Barriers/Prognosis Comment -- CHL IP DIET RECOMMENDATION 06/23/2016 SLP Diet Recommendations NPO Liquid Administration via -- Medication Administration Via alternative means Compensations -- Postural Changes --   CHL IP OTHER RECOMMENDATIONS 06/23/2016 Recommended Consults -- Oral Care Recommendations Oral care QID Other Recommendations --   CHL IP FOLLOW UP RECOMMENDATIONS 06/23/2016 Follow up Recommendations Home health SLP   CHL IP FREQUENCY AND DURATION 06/23/2016 Speech Therapy Frequency (ACUTE ONLY) min 2x/week Treatment Duration 2 weeks      CHL IP ORAL PHASE 06/23/2016 Oral Phase Impaired Oral - Pudding Teaspoon -- Oral - Pudding Cup -- Oral - Honey Teaspoon -- Oral - Honey Cup -- Oral - Nectar Teaspoon Weak lingual manipulation;Reduced posterior propulsion;Delayed oral transit;Decreased bolus cohesion;Premature spillage Oral - Nectar Cup -- Oral - Nectar Straw -- Oral - Thin Teaspoon Reduced posterior propulsion;Weak lingual manipulation;Delayed oral transit;Premature spillage Oral - Thin Cup Weak lingual manipulation;Reduced posterior propulsion;Delayed oral transit;Premature spillage Oral - Thin Straw Weak lingual manipulation;Premature spillage;Reduced posterior propulsion;Delayed oral transit Oral - Puree Weak lingual manipulation;Delayed oral transit;Reduced posterior propulsion;Premature spillage Oral - Mech Soft -- Oral - Regular -- Oral - Multi-Consistency -- Oral - Pill -- Oral Phase - Comment --  CHL IP PHARYNGEAL PHASE 06/23/2016 Pharyngeal Phase Impaired Pharyngeal- Pudding Teaspoon -- Pharyngeal -- Pharyngeal- Pudding Cup -- Pharyngeal -- Pharyngeal- Honey Teaspoon -- Pharyngeal -- Pharyngeal-  Honey Cup -- Pharyngeal -- Pharyngeal- Nectar Teaspoon Delayed swallow initiation-vallecula;Reduced pharyngeal peristalsis;Reduced epiglottic inversion;Reduced  anterior laryngeal mobility;Reduced laryngeal elevation;Reduced airway/laryngeal closure;Pharyngeal residue - valleculae;Pharyngeal residue - pyriform;Trace aspiration;Penetration/Apiration after swallow Pharyngeal Material enters airway, passes BELOW cords without attempt by patient to eject out (silent aspiration) Pharyngeal- Nectar Cup -- Pharyngeal -- Pharyngeal- Nectar Straw -- Pharyngeal -- Pharyngeal- Thin Teaspoon Delayed swallow initiation-vallecula;Reduced airway/laryngeal closure;Reduced tongue base retraction;Reduced laryngeal elevation;Reduced anterior laryngeal mobility;Reduced epiglottic inversion;Reduced pharyngeal peristalsis;Pharyngeal residue - valleculae;Pharyngeal residue - pyriform Pharyngeal -- Pharyngeal- Thin Cup Delayed swallow initiation-vallecula;Reduced airway/laryngeal closure;Reduced pharyngeal peristalsis;Reduced epiglottic inversion;Reduced anterior laryngeal mobility;Reduced laryngeal elevation;Reduced tongue base retraction;Pharyngeal residue - valleculae;Pharyngeal residue - pyriform;Penetration/Aspiration during swallow;Penetration/Apiration after swallow Pharyngeal -- Pharyngeal- Thin Straw Delayed swallow initiation-vallecula;Reduced airway/laryngeal closure;Reduced tongue base retraction;Reduced epiglottic inversion;Reduced pharyngeal peristalsis;Reduced anterior laryngeal mobility;Reduced laryngeal elevation;Penetration/Aspiration during swallow;Penetration/Apiration after swallow;Trace aspiration Pharyngeal Material enters airway, passes BELOW cords without attempt by patient to eject out (silent aspiration) Pharyngeal- Puree Reduced pharyngeal peristalsis;Reduced epiglottic inversion;Reduced anterior laryngeal mobility;Reduced laryngeal elevation;Reduced airway/laryngeal closure;Reduced tongue base  retraction;Pharyngeal residue - valleculae Pharyngeal -- Pharyngeal- Mechanical Soft -- Pharyngeal -- Pharyngeal- Regular -- Pharyngeal -- Pharyngeal- Multi-consistency -- Pharyngeal -- Pharyngeal- Pill -- Pharyngeal -- Pharyngeal Comment various postures attempted including chin tuck and head turn left *to weak side* without pt ability to perform adequately; pt did cough x1 on command and reflexively swallowed 1/4 opportunities  CHL IP CERVICAL ESOPHAGEAL PHASE 06/23/2016 Cervical Esophageal Phase Impaired Pudding Teaspoon -- Pudding Cup -- Honey Teaspoon -- Honey Cup -- Nectar Teaspoon -- Nectar Cup -- Nectar Straw -- Thin Teaspoon -- Thin Cup -- Thin Straw -- Puree -- Mechanical Soft -- Regular -- Multi-consistency -- Pill -- Cervical Esophageal Comment secretion retention noted at pyriform sinus that mixed with barium without pt awareness, upon esophageal sweep at end of study, pt appeared with mild barium retention (also mixed with secretions) at distal region without awareness, SLP questions if this could contribute to pt's chronic "throat clearing" reported by his son No flowsheet data found. Mickie Bail Maxwell, [place] [facility] [place] [phone]                  Subjective: Patient seen and evaluated.  He is sleeping in bed.  Ate well yesterday and this morning.  Stable for discharge to SNF today.  Able to tolerate PO medications yesterday.     Discharge Exam: Vitals:   06/29/16 1030 06/29/16 1046  BP: (!) 165/104 132/80  Pulse: 83   Resp: 18   Temp: 97.5 F (36.4 C)    Vitals:   06/29/16 0225 06/29/16 0652 06/29/16 1030 06/29/16 1046  BP: (!) 161/100 (!) 177/101 (!) 165/104 132/80  Pulse: 86 83 83   Resp: 18 18 18    Temp: 97.8 F (36.6 C) 97.8 F (36.6 C) 97.5 F (36.4 C)   TempSrc: Axillary Axillary Axillary   SpO2: 98% 98% 96%   Weight:      Height:        General: Pt is alert, awake, not in acute distress Cardiovascular: irregularly irregular, S1/S2 +, no rubs, no  gallops Respiratory: CTA bilaterally, no wheezing, no rhonchi Abdominal: Soft, NT, ND, bowel sounds + Extremities: no edema, no cyanosis    The results of significant diagnostics from this hospitalization (including imaging, microbiology, ancillary and laboratory) are listed below for reference.     Microbiology: No results found for this or any previous visit (from the past 240 hour(s)).   Labs: BNP (last 3 results) No results for input(s): BNP in the last 8760 hours. Basic Metabolic Panel:  Recent Labs Lab  06/23/16 0415 06/24/16 0440 06/27/16 1002 06/28/16 0912  NA 139 137 146* 148*  K 3.5 3.3* 3.0* 4.0  CL 101 102 116* 119*  CO2 25 24 24 22   GLUCOSE 149* 146* 181* 219*  BUN 14 14 19 18   CREATININE 0.94 0.86 0.67 0.75  CALCIUM 8.2* 7.9* 8.3* 8.4*  MG 1.2* 2.0  --   --    Liver Function Tests: No results for input(s): AST, ALT, ALKPHOS, BILITOT, PROT, ALBUMIN in the last 168 hours. No results for input(s): LIPASE, AMYLASE in the last 168 hours.  Recent Labs Lab 06/22/16 1707  AMMONIA 11   CBC:  Recent Labs Lab 06/25/16 0508 06/26/16 0214 06/27/16 0517 06/28/16 0912 06/29/16 0354  WBC 8.7 7.7 6.5 6.5 5.9  HGB 12.8* 14.1 12.5* 13.4 12.3*  HCT 39.0 42.8 38.3* 41.9 38.2*  MCV 92.4 92.8 92.5 93.9 94.1  PLT 152 178 162 168 171   Cardiac Enzymes: No results for input(s): CKTOTAL, CKMB, CKMBINDEX, TROPONINI in the last 168 hours. BNP: Invalid input(s): POCBNP CBG:  Recent Labs Lab 06/28/16 0547 06/28/16 1133 06/28/16 1606 06/28/16 2218 06/29/16 0649  GLUCAP 189* 275* 199* 212* 161*   D-Dimer No results for input(s): DDIMER in the last 72 hours. Hgb A1c No results for input(s): HGBA1C in the last 72 hours. Lipid Profile No results for input(s): CHOL, HDL, LDLCALC, TRIG, CHOLHDL, LDLDIRECT in the last 72 hours. Thyroid function studies No results for input(s): TSH, T4TOTAL, T3FREE, THYROIDAB in the last 72 hours.  Invalid input(s):  FREET3 Anemia work up No results for input(s): VITAMINB12, FOLATE, FERRITIN, TIBC, IRON, RETICCTPCT in the last 72 hours. Urinalysis    Component Value Date/Time   COLORURINE AMBER (A) 06/22/2016 0022   APPEARANCEUR CLEAR 06/22/2016 0022   LABSPEC 1.012 06/22/2016 0022   PHURINE 6.0 06/22/2016 0022   GLUCOSEU NEGATIVE 06/22/2016 0022   HGBUR NEGATIVE 06/22/2016 0022   BILIRUBINUR NEGATIVE 06/22/2016 0022   KETONESUR NEGATIVE 06/22/2016 0022   PROTEINUR NEGATIVE 06/22/2016 0022   NITRITE NEGATIVE 06/22/2016 0022   LEUKOCYTESUR TRACE (A) 06/22/2016 0022   Sepsis Labs Invalid input(s): PROCALCITONIN,  WBC,  LACTICIDVEN Microbiology No results found for this or any previous visit (from the past 240 hour(s)).   Time coordinating discharge: Over 30 minutes  SIGNED:   Bennett Scrape, MD  [facility] 06/29/2016, 11:33 AM Pager [phone] If 7PM-7AM, please contact night-coverage [email or web address] Password [email or web address]

## 2016-06-29 NOTE — Clinical Social Work Placement (Signed)
   CLINICAL SOCIAL WORK PLACEMENT  NOTE  Date:  06/29/2016  Patient Details  Name: Barry Jones MRN: [id number] Date of Birth: 08/27/1931  Clinical Social Work is seeking post-discharge placement for this patient at the Skilled  Nursing Facility level of care (*CSW will initial, date and re-position this form in  chart as items are completed):  Yes   Patient/family provided with [facility] Clinical Social Work Department's list of facilities offering this level of care within the geographic area requested by the patient (or if unable, by the patient's family).  Yes   Patient/family informed of their freedom to choose among providers that offer the needed level of care, that participate in Medicare, Medicaid or managed care program needed by the patient, have an available bed and are willing to accept the patient.  Yes   Patient/family informed of [name]'s ownership interest in [facility] and [facility], as well as of the fact that they are under no obligation to receive care at these facilities.  PASRR submitted to EDS on 06/28/16     PASRR number received on 06/28/16     Existing PASRR number confirmed on       FL2 transmitted to all facilities in geographic area requested by pt/family on 06/28/16     FL2 transmitted to all facilities within larger geographic area on       Patient informed that his/her managed care company has contracts with or will negotiate with certain facilities, including the following:        Yes   Patient/family informed of bed offers received.  [facility]     Physician recommends and patient chooses bed at      [facility] on 06/29/16.  Patient to be transferred to facility by [facility]     Patient family notified on 06/29/16 of transfer.  Name of family member notified:  [name], Barry Jones     PHYSICIAN       Additional Comment:     _______________________________________________ Dede QuerySarah Merton Wadlow, LCSW 06/29/2016, 12:58 PM

## 2016-06-29 NOTE — Progress Notes (Signed)
PT Cancellation Note  Patient Details Name: Barry Jones MRN: [id number] DOB: 05/18/1931   Cancelled Treatment:    Reason Eval/Treat Not Completed: Medical issues which prohibited therapy (BP) BP 164/105 and awaiting BP meds from pharmacy. PT will check on pt later as time allows.    Derek MoundKellyn R Teruko Joswick Gerritt Galentine, PTA Pager: [phone]   06/29/2016, 10:38 AM

## 2016-06-29 NOTE — Progress Notes (Signed)
Pt being transferred to SNF per orders from MD. Pt and family aware of transfer. RN called and gave report to [place] at facility. All questions and concerns were addressed. Pt will exit hospital via wheelchair.

## 2016-07-03 DIAGNOSIS — I481 Persistent atrial fibrillation: Secondary | ICD-10-CM | POA: Diagnosis not present

## 2016-07-03 DIAGNOSIS — I638 Other cerebral infarction: Secondary | ICD-10-CM | POA: Diagnosis not present

## 2016-07-03 DIAGNOSIS — R1310 Dysphagia, unspecified: Secondary | ICD-10-CM | POA: Diagnosis not present

## 2016-07-03 DIAGNOSIS — I10 Essential (primary) hypertension: Secondary | ICD-10-CM | POA: Diagnosis not present

## 2016-07-03 DIAGNOSIS — E119 Type 2 diabetes mellitus without complications: Secondary | ICD-10-CM | POA: Diagnosis not present

## 2016-07-04 DIAGNOSIS — E119 Type 2 diabetes mellitus without complications: Secondary | ICD-10-CM | POA: Diagnosis not present

## 2016-07-04 DIAGNOSIS — Z130 Encounter for screening for diseases of the blood and blood-forming organs and certain disorders involving the immune mechanism: Secondary | ICD-10-CM | POA: Diagnosis not present

## 2016-07-07 ENCOUNTER — Ambulatory Visit: Payer: Medicare Other | Admitting: Neurology

## 2016-07-08 DIAGNOSIS — G4700 Insomnia, unspecified: Secondary | ICD-10-CM | POA: Diagnosis not present

## 2016-07-08 DIAGNOSIS — E119 Type 2 diabetes mellitus without complications: Secondary | ICD-10-CM | POA: Diagnosis not present

## 2016-07-08 DIAGNOSIS — B353 Tinea pedis: Secondary | ICD-10-CM | POA: Diagnosis not present

## 2016-07-08 DIAGNOSIS — R21 Rash and other nonspecific skin eruption: Secondary | ICD-10-CM | POA: Diagnosis not present

## 2016-07-10 DIAGNOSIS — B353 Tinea pedis: Secondary | ICD-10-CM | POA: Diagnosis not present

## 2016-07-10 DIAGNOSIS — E119 Type 2 diabetes mellitus without complications: Secondary | ICD-10-CM | POA: Diagnosis not present

## 2016-07-10 DIAGNOSIS — G4700 Insomnia, unspecified: Secondary | ICD-10-CM | POA: Diagnosis not present

## 2016-07-10 DIAGNOSIS — R21 Rash and other nonspecific skin eruption: Secondary | ICD-10-CM | POA: Diagnosis not present

## 2016-07-16 DIAGNOSIS — E1129 Type 2 diabetes mellitus with other diabetic kidney complication: Secondary | ICD-10-CM | POA: Diagnosis not present

## 2016-07-16 DIAGNOSIS — N390 Urinary tract infection, site not specified: Secondary | ICD-10-CM | POA: Diagnosis not present

## 2016-07-16 DIAGNOSIS — I10 Essential (primary) hypertension: Secondary | ICD-10-CM | POA: Diagnosis not present

## 2016-07-16 DIAGNOSIS — E785 Hyperlipidemia, unspecified: Secondary | ICD-10-CM | POA: Diagnosis not present

## 2016-07-16 DIAGNOSIS — E119 Type 2 diabetes mellitus without complications: Secondary | ICD-10-CM | POA: Diagnosis not present

## 2016-07-16 DIAGNOSIS — I638 Other cerebral infarction: Secondary | ICD-10-CM | POA: Diagnosis not present

## 2016-07-16 DIAGNOSIS — I639 Cerebral infarction, unspecified: Secondary | ICD-10-CM | POA: Diagnosis not present

## 2016-07-20 ENCOUNTER — Ambulatory Visit (INDEPENDENT_AMBULATORY_CARE_PROVIDER_SITE_OTHER): Payer: Medicare Other | Admitting: Neurology

## 2016-07-20 ENCOUNTER — Encounter: Payer: Self-pay | Admitting: Neurology

## 2016-07-20 VITALS — BP 102/57 | HR 74 | Ht 69.0 in | Wt 155.2 lb

## 2016-07-20 DIAGNOSIS — I4891 Unspecified atrial fibrillation: Secondary | ICD-10-CM | POA: Diagnosis not present

## 2016-07-20 DIAGNOSIS — I69998 Other sequelae following unspecified cerebrovascular disease: Secondary | ICD-10-CM | POA: Diagnosis not present

## 2016-07-20 DIAGNOSIS — R1310 Dysphagia, unspecified: Secondary | ICD-10-CM

## 2016-07-20 DIAGNOSIS — I69859 Hemiplegia and hemiparesis following other cerebrovascular disease affecting unspecified side: Secondary | ICD-10-CM | POA: Diagnosis not present

## 2016-07-20 DIAGNOSIS — G40209 Localization-related (focal) (partial) symptomatic epilepsy and epileptic syndromes with complex partial seizures, not intractable, without status epilepticus: Secondary | ICD-10-CM | POA: Diagnosis not present

## 2016-07-20 DIAGNOSIS — F0391 Unspecified dementia with behavioral disturbance: Secondary | ICD-10-CM | POA: Diagnosis not present

## 2016-07-20 DIAGNOSIS — M6281 Muscle weakness (generalized): Secondary | ICD-10-CM | POA: Diagnosis not present

## 2016-07-20 DIAGNOSIS — G9340 Encephalopathy, unspecified: Secondary | ICD-10-CM | POA: Diagnosis not present

## 2016-07-20 MED ORDER — DIVALPROEX SODIUM 500 MG PO DR TAB: 500.0000 mg | DELAYED_RELEASE_TABLET | Freq: Two times a day (BID) | ORAL | 11 refills | Status: AC

## 2016-07-20 NOTE — Progress Notes (Signed)
PATIENT: Barry Jones DOB: 01/06/1931  Chief Complaint  Patient presents with  . Cerebrovascular Accident    MMSE 14/30 - 4 animals. He is here with his daughter, Barry Jones and son, Barry Jones.  They feel his memory is worse.  He is still having swallowing and gait difficulty.  He has been in rehab but is back at home now, under 24-hour supervision.  Home health is coming today for their first visit.     HISTORICAL  Barry Jones is a 82 years old right-handed male, accompanied by his daughter Barry Jones and son Barry Jones, seen in refer by his primary care physician Dr. Paulina Fusiouglas E Jones for evaluation of stroke, seizure, memory loss, with sundowning phenomenon, initial evaluation was on July 20 2016.  I have reviewed his previous hospital admission, first admission was in August 12 to 15 2017, he had a past medical history of hypertension, diabetes, atrial fibrillation, memory loss, hearing loss, was on aspirin 81 mg daily. [facility] was [facility] following acute onset of left-sided weakness on April 18 2016 at 2:00. Also has gaze to the right side, he received TPA, showed improvement of his left lower extremity weakness, but had persistent gaze deviation to the right side. There was also elevated blood pressure required IV labetalol treatment. He was later transferred to neuro ICU for treatment.  I have personally reviewed the CT scan on August twelfth 2017, scattered areas of acute infarction in the right MCA territory. 5.5 cubic cm hematoma in the right parietal infarction region. Old left PCA territory infarction. Chronic small-vessel ischemic changes.  Mri and MRA Wo Contrast 04/19/2016 Areas of acute infarction within the right MCA territory. The largest region in the right parietal lobe measures about 3 cm in size and is associated with some blood products. This could be petechial bleeding or could be a frank hematoma. CT scan suggested for further evaluation. Old left PCA territory  infarction. Chronic small-vessel ischemic changes elsewhere throughout the brain. No major vessel occlusion or correctable proximal stenosis. Distal vessel atherosclerotic irregularity diffusely.  Carotid Doppler Bilateral: intimal wall thickening CCA. mild mixed plaque origin ICA. 1-39% ICA plaquing. Vertebral artery flow is antegrade.  2-D echocardiogram: Mild left ventricular hypertrophy, ejection fraction 55%. Wall motion was normal.    Laboratory evaluation showed A1c of 6.4, LDL 96, his symptoms has much improved, was started on Eliquis later.  He was [facility] to [facility] June 21 2016. He was noted to have staring into space, slurred speech, gaze preference to the right side, he had prolonged complex partial seizure lasting more than 90 minutes, was loaded with Keppra, now taking Keppra 500 mg twice a day. EEG showed generalized slowing.  He lives at home, widowed, has 4 children take turns looking after him, has 24x 7 cares at home, he has good appetite, has dysphagia, swallowing studies pending, children reported that he has broken sleep pattern, get very restless confused at evening time, woke up at nighttime, he is still able to attend his chickens, feet them during the day, walk around the yard,  He was started on trazodone 25 mg every night since October 2017, which has been helpful.  I have personally reviewed MRI of the brain on April 19 2016, acute infarction within the right MCA territory, largest 3 cm, associated with blood product, old left MCA stroke involving left occipital region, generalized atrophy, supratentorium small vessel disease.  MRA of the brain showed no large vessel disease.  Laboratory evaluation  in August 2017 showed normal B12 672, normal CBC, with hemoglobin 13.3, cholesterol 151, LDL 96, elevated A1c 6.4,  EEG showed generalized slowing on Oct 16th 2017  REVIEW OF SYSTEMS: Full 14 system review of systems performed and notable only  for memory loss, significant hearing loss, confusion, difficulty swallowing, insomnia, easy bruising, loss of vision, weight loss, swelling legs, trouble swallowing  ALLERGIES: Allergies  Allergen Reactions  . Ciprofloxacin Other (See Comments)    Made pt crazy    HOME MEDICATIONS: Current Outpatient Prescriptions  Medication Sig Dispense Refill  . atenolol (TENORMIN) 25 MG tablet 3 (three) times daily.    Marland Kitchen. atorvastatin (LIPITOR) 20 MG tablet Take 1 tablet (20 mg total) by mouth daily at 6 PM. 30 tablet 2  . Cyanocobalamin (B-12 PO) Take 1,000 mcg by mouth daily.     Marland Kitchen. donepezil (ARICEPT) 5 MG tablet daily.    Marland Kitchen. ELIQUIS 5 MG TABS tablet Take 5 mg by mouth daily.    . furosemide (LASIX) 40 MG tablet Take 40 mg by mouth daily.    Marland Kitchen. levETIRAcetam (KEPPRA) 500 MG tablet Take 1 tablet (500 mg total) by mouth 2 (two) times daily.    . Melatonin 10 MG TABS Take by mouth at bedtime.    . memantine (NAMENDA) 5 MG tablet daily.    . metFORMIN (GLUCOPHAGE) 500 MG tablet Take 500 mg by mouth 2 (two) times daily with a meal.    . metoprolol tartrate (LOPRESSOR) 25 mg/10 mL SUSP Take 2.5 mLs (6.25 mg total) by mouth every 6 (six) hours.    . nitrofurantoin (MACRODANTIN) 100 MG capsule Take 100 mg by mouth daily.     No current facility-administered medications for this visit.     PAST MEDICAL HISTORY: Past Medical History:  Diagnosis Date  . Chronic atrial fibrillation (HCC)   . Diabetes mellitus type II, controlled (HCC)   . HTN (hypertension)   . Stroke [facility](HCC) 04/2016    PAST SURGICAL HISTORY: Past Surgical History:  Procedure Laterality Date  . HERNIA REPAIR    . PROSTATE SURGERY      FAMILY HISTORY: History reviewed. No pertinent family history.  SOCIAL HISTORY:  Social History   Social History  . Marital status: Widowed    Spouse name: N/A  . Number of children: 5  . Years of education: 6-7 years   Occupational History  . Retired    Social History Main Topics  .  Smoking status: Former Games developermoker  . Smokeless tobacco: Never Used     Comment: quit many years ago  . Alcohol use No  . Drug use: No  . Sexual activity: Not on file   Other Topics Concern  . Not on file   Social History Narrative   Lives at home alone with 24-hour supervision.   Right-handed.   No caffeine use.      PHYSICAL EXAM   Vitals:   07/20/16 0941  BP: (!) 102/57  Pulse: 74  Weight: 155 lb 4 oz (70.4 kg)  Height: 5\' 9"  (1.753 m)    Not recorded      Body mass index is 22.93 kg/m.  PHYSICAL EXAMNIATION:  Gen: NAD, conversant, well nourised, obese, well groomed                     Cardiovascular: Regular rate rhythm, no peripheral edema, warm, nontender. Eyes: Conjunctivae clear without exudates or hemorrhage Neck: Supple, no carotid bruits. Pulmonary: Clear to auscultation bilaterally  NEUROLOGICAL EXAM:  MENTAL STATUS: Speech:    Speech is normal; fluent and spontaneous with normal comprehension.  Cognition:Mini-Mental Status Examination 14/30,     Orientation: He is oriented to date, year, month, Dr.      recent and remote memory: He missed 3 out of 3 recalls     Attention span and concentration, he could not spell world backwards     Normal Language, naming, repeating,spontaneous speech: He has difficulty copy design or write a sentence.     Fund of knowledge:    CRANIAL NERVES: CN II: Visual fields are full to confrontation. Fundoscopic exam is normal with sharp discs and no vascular changes. Pupils are round equal and briskly reactive to light. CN III, IV, VI: extraocular movement are normal. No ptosis. CN V: Facial sensation is intact to pinprick in all 3 divisions bilaterally. Corneal responses are intact.  CN VII: Face is symmetric with normal eye closure and smile. CN VIII: Very hard of hearing CN IX, X: Palate elevates symmetrically. Phonation is normal. CN XI: Head turning and shoulder shrug are intact CN XII: Tongue is midline with normal  movements and no atrophy.  MOTOR: There is no pronator drift of out-stretched arms. Muscle bulk and tone are normal. Muscle strength is normal.  REFLEXES: Reflexes are 2+ and symmetric at the biceps, triceps, knees, and ankles. Plantar responses are flexor.  SENSORY: Intact to light touch, pinprick, positional sensation and vibratory sensation are intact in fingers and toes.  COORDINATION: Rapid alternating movements and fine finger movements are intact. There is no dysmetria on finger-to-nose and heel-knee-shin.    GAIT/STANCE: He need to push up from chair arm to get up from seated position, bending forward, mildly unsteady  DIAGNOSTIC DATA (LABS, IMAGING, TESTING) - I reviewed patient records, labs, notes, testing and imaging myself where available.   ASSESSMENT AND PLAN  Alder Murri is a 81 y.o. male    Embolic stroke Chronic atrial fibrillation Complex partial seizure Dementia with behavioral issues Dysphagia  Stop Keppra  Start Depakote DR 500 mg twice a day   Levert Feinstein, M.D. Ph.D.  [facility] [address], [address] [place], [place] [address] Ph: [phone] Fax: [phone]  CC: Referring Provider

## 2016-07-21 DIAGNOSIS — I639 Cerebral infarction, unspecified: Secondary | ICD-10-CM | POA: Diagnosis not present

## 2016-07-21 DIAGNOSIS — R1310 Dysphagia, unspecified: Secondary | ICD-10-CM | POA: Diagnosis not present

## 2016-07-22 DIAGNOSIS — M6281 Muscle weakness (generalized): Secondary | ICD-10-CM | POA: Diagnosis not present

## 2016-07-22 DIAGNOSIS — I4891 Unspecified atrial fibrillation: Secondary | ICD-10-CM | POA: Diagnosis not present

## 2016-07-22 DIAGNOSIS — I69998 Other sequelae following unspecified cerebrovascular disease: Secondary | ICD-10-CM | POA: Diagnosis not present

## 2016-07-22 DIAGNOSIS — G9340 Encephalopathy, unspecified: Secondary | ICD-10-CM | POA: Diagnosis not present

## 2016-07-22 DIAGNOSIS — I69859 Hemiplegia and hemiparesis following other cerebrovascular disease affecting unspecified side: Secondary | ICD-10-CM | POA: Diagnosis not present

## 2016-07-27 DIAGNOSIS — M6281 Muscle weakness (generalized): Secondary | ICD-10-CM | POA: Diagnosis not present

## 2016-07-27 DIAGNOSIS — I4891 Unspecified atrial fibrillation: Secondary | ICD-10-CM | POA: Diagnosis not present

## 2016-07-27 DIAGNOSIS — I69998 Other sequelae following unspecified cerebrovascular disease: Secondary | ICD-10-CM | POA: Diagnosis not present

## 2016-07-27 DIAGNOSIS — I69859 Hemiplegia and hemiparesis following other cerebrovascular disease affecting unspecified side: Secondary | ICD-10-CM | POA: Diagnosis not present

## 2016-07-27 DIAGNOSIS — G9340 Encephalopathy, unspecified: Secondary | ICD-10-CM | POA: Diagnosis not present

## 2016-07-28 DIAGNOSIS — G9340 Encephalopathy, unspecified: Secondary | ICD-10-CM | POA: Diagnosis not present

## 2016-07-28 DIAGNOSIS — I69859 Hemiplegia and hemiparesis following other cerebrovascular disease affecting unspecified side: Secondary | ICD-10-CM | POA: Diagnosis not present

## 2016-07-28 DIAGNOSIS — I4891 Unspecified atrial fibrillation: Secondary | ICD-10-CM | POA: Diagnosis not present

## 2016-07-28 DIAGNOSIS — M6281 Muscle weakness (generalized): Secondary | ICD-10-CM | POA: Diagnosis not present

## 2016-07-28 DIAGNOSIS — I69998 Other sequelae following unspecified cerebrovascular disease: Secondary | ICD-10-CM | POA: Diagnosis not present

## 2016-07-31 ENCOUNTER — Other Ambulatory Visit: Payer: Self-pay

## 2016-07-31 DIAGNOSIS — G9340 Encephalopathy, unspecified: Secondary | ICD-10-CM | POA: Diagnosis not present

## 2016-07-31 DIAGNOSIS — M6281 Muscle weakness (generalized): Secondary | ICD-10-CM | POA: Diagnosis not present

## 2016-07-31 DIAGNOSIS — I4891 Unspecified atrial fibrillation: Secondary | ICD-10-CM | POA: Diagnosis not present

## 2016-07-31 DIAGNOSIS — I69859 Hemiplegia and hemiparesis following other cerebrovascular disease affecting unspecified side: Secondary | ICD-10-CM | POA: Diagnosis not present

## 2016-07-31 DIAGNOSIS — I69998 Other sequelae following unspecified cerebrovascular disease: Secondary | ICD-10-CM | POA: Diagnosis not present

## 2016-07-31 NOTE — Patient Outreach (Signed)
Telephone outreach to patient to obtain mRS was successfully completed. mRS = 3 

## 2016-07-31 NOTE — Patient Outreach (Signed)
First outreach attempt to obtain mRS. No answer, left message for return call.   Barry CanesNicole Johnnay Pleitez, B.A.  [facility] Care Management Assistant

## 2016-08-03 DIAGNOSIS — I69998 Other sequelae following unspecified cerebrovascular disease: Secondary | ICD-10-CM | POA: Diagnosis not present

## 2016-08-03 DIAGNOSIS — M6281 Muscle weakness (generalized): Secondary | ICD-10-CM | POA: Diagnosis not present

## 2016-08-03 DIAGNOSIS — G9340 Encephalopathy, unspecified: Secondary | ICD-10-CM | POA: Diagnosis not present

## 2016-08-03 DIAGNOSIS — I4891 Unspecified atrial fibrillation: Secondary | ICD-10-CM | POA: Diagnosis not present

## 2016-08-03 DIAGNOSIS — I69859 Hemiplegia and hemiparesis following other cerebrovascular disease affecting unspecified side: Secondary | ICD-10-CM | POA: Diagnosis not present

## 2016-08-06 DIAGNOSIS — I69998 Other sequelae following unspecified cerebrovascular disease: Secondary | ICD-10-CM | POA: Diagnosis not present

## 2016-08-06 DIAGNOSIS — G9340 Encephalopathy, unspecified: Secondary | ICD-10-CM | POA: Diagnosis not present

## 2016-08-06 DIAGNOSIS — I4891 Unspecified atrial fibrillation: Secondary | ICD-10-CM | POA: Diagnosis not present

## 2016-08-06 DIAGNOSIS — I69859 Hemiplegia and hemiparesis following other cerebrovascular disease affecting unspecified side: Secondary | ICD-10-CM | POA: Diagnosis not present

## 2016-08-06 DIAGNOSIS — M6281 Muscle weakness (generalized): Secondary | ICD-10-CM | POA: Diagnosis not present

## 2016-08-11 DIAGNOSIS — I69859 Hemiplegia and hemiparesis following other cerebrovascular disease affecting unspecified side: Secondary | ICD-10-CM | POA: Diagnosis not present

## 2016-08-11 DIAGNOSIS — G9340 Encephalopathy, unspecified: Secondary | ICD-10-CM | POA: Diagnosis not present

## 2016-08-11 DIAGNOSIS — I69998 Other sequelae following unspecified cerebrovascular disease: Secondary | ICD-10-CM | POA: Diagnosis not present

## 2016-08-11 DIAGNOSIS — I4891 Unspecified atrial fibrillation: Secondary | ICD-10-CM | POA: Diagnosis not present

## 2016-08-11 DIAGNOSIS — M6281 Muscle weakness (generalized): Secondary | ICD-10-CM | POA: Diagnosis not present

## 2016-08-13 DIAGNOSIS — N390 Urinary tract infection, site not specified: Secondary | ICD-10-CM | POA: Diagnosis not present

## 2016-08-14 DIAGNOSIS — M6281 Muscle weakness (generalized): Secondary | ICD-10-CM | POA: Diagnosis not present

## 2016-08-14 DIAGNOSIS — G9340 Encephalopathy, unspecified: Secondary | ICD-10-CM | POA: Diagnosis not present

## 2016-08-14 DIAGNOSIS — I69998 Other sequelae following unspecified cerebrovascular disease: Secondary | ICD-10-CM | POA: Diagnosis not present

## 2016-08-14 DIAGNOSIS — I69859 Hemiplegia and hemiparesis following other cerebrovascular disease affecting unspecified side: Secondary | ICD-10-CM | POA: Diagnosis not present

## 2016-08-14 DIAGNOSIS — I4891 Unspecified atrial fibrillation: Secondary | ICD-10-CM | POA: Diagnosis not present

## 2016-08-19 DIAGNOSIS — I69859 Hemiplegia and hemiparesis following other cerebrovascular disease affecting unspecified side: Secondary | ICD-10-CM | POA: Diagnosis not present

## 2016-08-19 DIAGNOSIS — G9340 Encephalopathy, unspecified: Secondary | ICD-10-CM | POA: Diagnosis not present

## 2016-08-19 DIAGNOSIS — I69998 Other sequelae following unspecified cerebrovascular disease: Secondary | ICD-10-CM | POA: Diagnosis not present

## 2016-08-19 DIAGNOSIS — M6281 Muscle weakness (generalized): Secondary | ICD-10-CM | POA: Diagnosis not present

## 2016-08-19 DIAGNOSIS — I4891 Unspecified atrial fibrillation: Secondary | ICD-10-CM | POA: Diagnosis not present

## 2016-08-21 DIAGNOSIS — M6281 Muscle weakness (generalized): Secondary | ICD-10-CM | POA: Diagnosis not present

## 2016-08-21 DIAGNOSIS — I69859 Hemiplegia and hemiparesis following other cerebrovascular disease affecting unspecified side: Secondary | ICD-10-CM | POA: Diagnosis not present

## 2016-08-21 DIAGNOSIS — I69998 Other sequelae following unspecified cerebrovascular disease: Secondary | ICD-10-CM | POA: Diagnosis not present

## 2016-08-21 DIAGNOSIS — G9340 Encephalopathy, unspecified: Secondary | ICD-10-CM | POA: Diagnosis not present

## 2016-08-21 DIAGNOSIS — I4891 Unspecified atrial fibrillation: Secondary | ICD-10-CM | POA: Diagnosis not present

## 2016-09-04 ENCOUNTER — Ambulatory Visit: Payer: Medicare Other | Admitting: Sports Medicine

## 2016-09-09 ENCOUNTER — Ambulatory Visit: Payer: Medicare Other | Admitting: Sports Medicine

## 2016-09-23 ENCOUNTER — Ambulatory Visit: Payer: Medicare Other | Admitting: Neurology

## 2016-10-01 ENCOUNTER — Ambulatory Visit: Payer: Medicare Other | Admitting: Neurology

## 2016-10-12 DIAGNOSIS — I4891 Unspecified atrial fibrillation: Secondary | ICD-10-CM | POA: Diagnosis not present

## 2016-10-12 DIAGNOSIS — Z79899 Other long term (current) drug therapy: Secondary | ICD-10-CM | POA: Diagnosis not present

## 2016-10-12 DIAGNOSIS — E1129 Type 2 diabetes mellitus with other diabetic kidney complication: Secondary | ICD-10-CM | POA: Diagnosis not present

## 2016-10-12 DIAGNOSIS — E785 Hyperlipidemia, unspecified: Secondary | ICD-10-CM | POA: Diagnosis not present

## 2016-10-12 DIAGNOSIS — I639 Cerebral infarction, unspecified: Secondary | ICD-10-CM | POA: Diagnosis not present

## 2016-10-12 DIAGNOSIS — I10 Essential (primary) hypertension: Secondary | ICD-10-CM | POA: Diagnosis not present

## 2016-11-02 ENCOUNTER — Encounter: Payer: Self-pay | Admitting: Neurology

## 2016-11-02 ENCOUNTER — Ambulatory Visit (INDEPENDENT_AMBULATORY_CARE_PROVIDER_SITE_OTHER): Payer: Medicare Other | Admitting: Neurology

## 2016-11-02 VITALS — BP 182/93 | HR 73 | Ht 69.0 in | Wt 201.0 lb

## 2016-11-02 DIAGNOSIS — I6310 Cerebral infarction due to embolism of unspecified precerebral artery: Secondary | ICD-10-CM | POA: Diagnosis not present

## 2016-11-02 DIAGNOSIS — F0391 Unspecified dementia with behavioral disturbance: Secondary | ICD-10-CM

## 2016-11-02 DIAGNOSIS — I4891 Unspecified atrial fibrillation: Secondary | ICD-10-CM | POA: Diagnosis not present

## 2016-11-02 DIAGNOSIS — G40209 Localization-related (focal) (partial) symptomatic epilepsy and epileptic syndromes with complex partial seizures, not intractable, without status epilepticus: Secondary | ICD-10-CM

## 2016-11-02 DIAGNOSIS — R1310 Dysphagia, unspecified: Secondary | ICD-10-CM | POA: Diagnosis not present

## 2016-11-02 DIAGNOSIS — I63411 Cerebral infarction due to embolism of right middle cerebral artery: Secondary | ICD-10-CM | POA: Diagnosis not present

## 2016-11-02 MED ORDER — QUETIAPINE FUMARATE 25 MG PO TABS: 25.0000 mg | ORAL_TABLET | Freq: Every day | ORAL | 11 refills | Status: DC

## 2016-11-02 MED ORDER — QUETIAPINE FUMARATE 25 MG PO TABS: 50.0000 mg | ORAL_TABLET | Freq: Every day | ORAL | 11 refills | Status: DC

## 2016-11-02 NOTE — Progress Notes (Signed)
PATIENT: Barry Jones DOB: 1931-06-17  Chief Complaint  Patient presents with  . Cerebrovascular Accident    He is here with his daughter, Olegario Messier and caregiver, Aram Beecham.  He is unable to complete the MMSE due to difficulty staying awake, confusion and hearing deficits.  He is now taking trazodone 75mg , qhs to help get him to sleep and to control his agitation.  He is still getting up during the night confused and thinking it is morning.  He has excessive daytime fatigue from not resting at night.  He is able to ambulate with a rolling walker but is slow moving.  He has not had any falls.  . Seizures    No seizure like events reported.     HISTORICAL  Barry Jones is a 83 years old right-handed male, accompanied by his daughter Olegario Messier and son Thayer Ohm, seen in refer by his primary care physician Dr. Paulina Fusi for evaluation of stroke, seizure, memory loss, with sundowning phenomenon, initial evaluation was on July 20 2016.  I have reviewed his previous hospital admission, first admission was in August 12 to 15 2017, he had a past medical history of hypertension, diabetes, atrial fibrillation, memory loss, hearing loss, was on aspirin 81 mg daily. He was brought to [facility] following acute onset of left-sided weakness on April 18 2016 at 2:00. Also has gaze to the right side, he received TPA, showed improvement of his left lower extremity weakness, but had persistent gaze deviation to the right side. There was also elevated blood pressure required IV labetalol treatment. He was later transferred to neuro ICU for treatment.  I have personally reviewed the CT scan on August twelfth 2017, scattered areas of acute infarction in the right MCA territory. 5.5 cubic cm hematoma in the right parietal infarction region. Old left PCA territory infarction. Chronic small-vessel ischemic changes.  Mri and MRA Wo Contrast 04/19/2016: Areas of acute infarction within the right MCA territory.  The largest region in the right parietal lobe measures about 3 cm in size and is associated with some blood products. This could be petechial bleeding or could be a frank hematoma. CT scan suggested for further evaluation. Old left PCA territory infarction. Chronic small-vessel ischemic changes elsewhere throughout the brain. No major vessel occlusion or correctable proximal stenosis. Distal vessel atherosclerotic irregularity diffusely.  Carotid Doppler Bilateral: intimal wall thickening CCA. mild mixed plaque origin ICA. 1-39% ICA plaquing. Vertebral artery flow is antegrade.  2-D echocardiogram: Mild left ventricular hypertrophy, ejection fraction 55%. Wall motion was normal.    Laboratory evaluation showed A1c of 6.4, LDL 96, his symptoms has much improved, was started on Eliquis later.  He was [facility] to [facility] again June 21 2016. He was noted to have staring into space, slurred speech, gaze preference to the right side, he had prolonged complex partial seizure lasting more than 90 minutes, was loaded with Keppra, now taking Keppra 500 mg twice a day. EEG showed generalized slowing.  He lives at home, widowed, has 4 children take turns looking after him, has 24x 7 cares at home, he has good appetite, has dysphagia, swallowing studies pending, children reported that he has broken sleep pattern, get very restless confused at evening time, woke up at nighttime, he is still able to attend his chickens, feet them during the day, walk around the yard,  He was started on trazodone 25 mg every night since October 2017, which has been helpful.  I have personally reviewed MRI  of the brain on April 19 2016, acute infarction within the right MCA territory, largest 3 cm, associated with blood product, old left MCA stroke involving left occipital region, generalized atrophy, supratentorium small vessel disease.  MRA of the brain showed no large vessel disease.  Laboratory evaluation  in August 2017 showed normal B12 672, normal CBC, with hemoglobin 13.3, cholesterol 151, LDL 96, elevated A1c 6.4,  EEG showed generalized slowing on Oct 16th 2017  UPDATE Feb 26th 2018: He has difficulty sleeping at night, up most of night,  He take frequent nap during the day, at night, he can be agitated, his trazadone was increased from 50 to 75 mg, which has been helpful,  He is also taking melatonin 10mg  qhs.  I reviewed the laboratory evaluation from his primary care physician October 12 2016, normal WBC 4.3, mildly decreased hemoglobin 11.8, creatinine 1.22, LDL 48, cholesterol 109, A1c was 6.2, walk. Acid level was 68,  REVIEW OF SYSTEMS: Full 14 system review of systems performed and notable only for as above  ALLERGIES: Allergies  Allergen Reactions  . Ciprofloxacin Other (See Comments)    Made pt crazy    HOME MEDICATIONS: Current Outpatient Prescriptions  Medication Sig Dispense Refill  . atorvastatin (LIPITOR) 20 MG tablet Take 1 tablet (20 mg total) by mouth daily at 6 PM. 30 tablet 2  . Cyanocobalamin (B-12 PO) Take 1,000 mcg by mouth daily.     . divalproex (DEPAKOTE) 500 MG DR tablet Take 1 tablet (500 mg total) by mouth 2 (two) times daily. 60 tablet 11  . donepezil (ARICEPT) 5 MG tablet daily.    Marland Kitchen. ELIQUIS 5 MG TABS tablet Take 5 mg by mouth daily.    . furosemide (LASIX) 40 MG tablet Take 40 mg by mouth daily.    . Melatonin 10 MG TABS Take by mouth at bedtime.    . memantine (NAMENDA) 5 MG tablet daily.    . metFORMIN (GLUCOPHAGE) 500 MG tablet Take 500 mg by mouth 2 (two) times daily with a meal.    . metoprolol tartrate (LOPRESSOR) 25 mg/10 mL SUSP Take 2.5 mLs (6.25 mg total) by mouth every 6 (six) hours.    . nitrofurantoin (MACRODANTIN) 100 MG capsule Take 100 mg by mouth daily.    . TRAZODONE HCL PO Take 75 mg by mouth at bedtime.     No current facility-administered medications for this visit.     PAST MEDICAL HISTORY: Past Medical History:    Diagnosis Date  . Chronic atrial fibrillation (HCC)   . Diabetes mellitus type II, controlled (HCC)   . HTN (hypertension)   . Stroke [facility](HCC) 04/2016    PAST SURGICAL HISTORY: Past Surgical History:  Procedure Laterality Date  . HERNIA REPAIR    . PROSTATE SURGERY      FAMILY HISTORY: No family history on file.  SOCIAL HISTORY:  Social History   Social History  . Marital status: Widowed    Spouse name: N/A  . Number of children: 5  . Years of education: 6-7 years   Occupational History  . Retired    Social History Main Topics  . Smoking status: Former Games developermoker  . Smokeless tobacco: Never Used     Comment: quit many years ago  . Alcohol use No  . Drug use: No  . Sexual activity: Not on file   Other Topics Concern  . Not on file   Social History Narrative   Lives at home alone with 24-hour supervision.  Right-handed.   No caffeine use.      PHYSICAL EXAM   Vitals:   11/02/16 1545  BP: (!) 182/93  Pulse: 73  Weight: 201 lb (91.2 kg)  Height: 5\' 9"  (1.753 m)    Not recorded      Body mass index is 29.68 kg/m.  PHYSICAL EXAMNIATION:  Gen: NAD, conversant, well nourised, obese, well groomed                     Cardiovascular: Regular rate rhythm, no peripheral edema, warm, nontender. Eyes: Conjunctivae clear without exudates or hemorrhage Neck: Supple, no carotid bruits. Pulmonary: Clear to auscultation bilaterally   NEUROLOGICAL EXAM:  MENTAL STATUS: Speech:    Speech is normal; fluent and spontaneous with normal comprehension.  Cognition: He is sleepy, not off frequently during conversation, is oriented to his name, not oriented to time, place, Is not oriented to his daughter's name   CRANIAL NERVES: CN ZO:XWRUE pupil was small, left pupil was irregular, pink to bilateral visual threat  CN V: Facial sensation is intact to pinprick in all 3 divisions bilaterally. Corneal responses are intact.  CN VII: Face is symmetric with normal eye  closure and smile. CN VIII: Very hard of hearing CN IX, X: Palate elevates symmetrically. Phonation is normal. CN XI: Head turning and shoulder shrug are intact CN XII: Tongue is midline with normal movements and no atrophy.  MOTOR: There is no pronator drift of out-stretched arms. Muscle bulk and tone are normal. Muscle strength is normal.  REFLEXES: Reflexes were hypoactive and symmetric at the biceps, triceps, knees, and ankles. Plantar responses are flexor.  SENSORY: Intact to light touch, pinprick, positional sensation and vibratory sensation are intact in fingers and toes.  COORDINATION: Rapid alternating movements and fine finger movements are intact. There is no dysmetria on finger-to-nose and heel-knee-shin.    GAIT/STANCE: He need to push up from chair arm to get up from seated position, bending forward, unsteady DIAGNOSTIC DATA (LABS, IMAGING, TESTING) - I reviewed patient records, labs, notes, testing and imaging myself where available.   ASSESSMENT AND PLAN  Fenix Rorke is a 83 y.o. male    Embolic stroke Chronic atrial fibrillation Complex partial seizure Dementia with behavioral issues  Keep Depakote DR 500 mg twice a day  Keep trazodone 75 mg every night   add on seroquel 25 mg titrating to 50 mg every night  Return to clinic withpractitioner in 6 months   Levert Feinstein, M.D. Ph.D.  [facility] [address] [place], [place] [address] Ph: [phone] Fax: [phone]  CC: Referring Provider

## 2016-11-05 ENCOUNTER — Telehealth: Payer: Self-pay | Admitting: Neurology

## 2016-11-05 NOTE — Telephone Encounter (Signed)
[name] called says he took 1st dose of QUEtiapine (SEROQUEL) 25 MG tablet Monday night 2/26 and d/c memantine and donepezil. He slept all that night and was more sleepy the next day. He slept well Tuesday night but yesterday he did not sleep much during the day. Says he was constantly seeing things that weren't there, trying to fix things that did not need it, she says these are symptoms he had but just increased in intensity. Last night he leaned against the wall and slid down it about 3 times. He pulled a bookcase over, tried to pull the refrigerator over, never slept and still awake this morning. Please call

## 2016-11-05 NOTE — Telephone Encounter (Signed)
I have called his daughter, he did very well with seroquel 25 mg every night on Monday and Tuesday, by last night, he was very agitated, moving refrigerator and furniture trying to fix the problem, he only slept 30 minutes,  He has stopped Aricept, Namenda, I have asked the daughter continue Namenda 25 mg 2 tablets every night,

## 2016-11-12 ENCOUNTER — Telehealth: Payer: Self-pay | Admitting: Neurology

## 2016-11-12 ENCOUNTER — Encounter: Payer: Self-pay | Admitting: *Deleted

## 2016-11-12 NOTE — Telephone Encounter (Signed)
Left message for a return call

## 2016-11-12 NOTE — Telephone Encounter (Signed)
Patients daughter [name] (listed on DPR) called office in reference to QUEtiapine (SEROQUEL) 25 MG tablet.  Daughter states patient is not sleeping at all on the medication and patient is not having good days.  Please call

## 2016-11-12 NOTE — Telephone Encounter (Signed)
Spoke to Barry Jones - the patent is still up all night and experiencing hallucinations.  Per vo by Dr. Terrace ArabiaYan, increase Seroquel to 75mg  at bedtime - continue Depaoke and Trazodone.  Barry Jones is agreeable to this plan.  She was instructed to call back with any further concerns.  Dr. Terrace ArabiaYan would like to see them, as a family, in the office if the Seroquel increase is not helpful.

## 2016-11-23 ENCOUNTER — Encounter: Payer: Self-pay | Admitting: Neurology

## 2016-11-23 ENCOUNTER — Ambulatory Visit (INDEPENDENT_AMBULATORY_CARE_PROVIDER_SITE_OTHER): Payer: Medicare Other | Admitting: Neurology

## 2016-11-23 ENCOUNTER — Telehealth: Payer: Self-pay | Admitting: Neurology

## 2016-11-23 VITALS — BP 144/75 | HR 83 | Ht 69.0 in | Wt 198.5 lb

## 2016-11-23 DIAGNOSIS — F0391 Unspecified dementia with behavioral disturbance: Secondary | ICD-10-CM

## 2016-11-23 DIAGNOSIS — I6310 Cerebral infarction due to embolism of unspecified precerebral artery: Secondary | ICD-10-CM | POA: Diagnosis not present

## 2016-11-23 DIAGNOSIS — F03918 Unspecified dementia, unspecified severity, with other behavioral disturbance: Secondary | ICD-10-CM | POA: Insufficient documentation

## 2016-11-23 DIAGNOSIS — G40209 Localization-related (focal) (partial) symptomatic epilepsy and epileptic syndromes with complex partial seizures, not intractable, without status epilepticus: Secondary | ICD-10-CM

## 2016-11-23 MED ORDER — RISPERIDONE 0.5 MG PO TABS
1.0000 mg | ORAL_TABLET | Freq: Every day | ORAL | 6 refills | Status: AC
Start: 2016-11-23 — End: ?

## 2016-11-23 MED ORDER — CLONAZEPAM 0.5 MG PO TABS: 0.5000 mg | ORAL_TABLET | Freq: Every evening | ORAL | 3 refills | Status: AC | PRN

## 2016-11-23 NOTE — Telephone Encounter (Signed)
[name] called office in reference to Seroquel 75mg .  States patient has not slept at all she went to his home at 4:30am this morning and patient had not slept.  Curtains were torn down and hearing aid went missing.  Daughter would like to see about decreasing the medication.  Please call

## 2016-11-23 NOTE — Telephone Encounter (Signed)
Pt daughter called for Barry Jones to let her know she will be able to get her father here at 4:00 appointment.  He has added to the schedule.

## 2016-11-23 NOTE — Telephone Encounter (Signed)
Pt daughter called for Marcelino DusterMichelle to let her know she will be able to get her father here at 4:00 appointment

## 2016-11-23 NOTE — Progress Notes (Signed)
PATIENT: Barry Jones DOB: 02/13/1931  Chief Complaint  Patient presents with  . Dementia    He is here with his daughter, Barry Jones, who is concerned about the following:  increased confusion, agitation, inability to sleep, hallucinations and behavioral disturbances (ex: ripped down his curtains, pulling down furniture).  She would like to discuss medication changes.  He is unable to comprehend the MMSE questions.     HISTORICAL  Barry Jones is a 83 years old right-handed male, accompanied by his daughter Barry Jones and son Barry Jones, seen in refer by his primary care physician Dr. Paulina Fusi for evaluation of stroke, seizure, memory loss, with sundowning phenomenon, initial evaluation was on July 20 2016.  I have reviewed his previous hospital admission, first admission was in August 12 to 15 2017, he had a past medical history of hypertension, diabetes, atrial fibrillation, memory loss, hearing loss, was on aspirin 81 mg daily. He was brought to [facility] following acute onset of left-sided weakness on April 18 2016 at 2:00. Also has gaze to the right side, he received TPA, showed improvement of his left lower extremity weakness, but had persistent gaze deviation to the right side. There was also elevated blood pressure required IV labetalol treatment. He was later transferred to neuro ICU for treatment.  I have personally reviewed the CT scan on August twelfth 2017, scattered areas of acute infarction in the right MCA territory. 5.5 cubic cm hematoma in the right parietal infarction region. Old left PCA territory infarction. Chronic small-vessel ischemic changes.  Mri and MRA Wo Contrast 04/19/2016: Areas of acute infarction within the right MCA territory. The largest region in the right parietal lobe measures about 3 cm in size and is associated with some blood products. This could be petechial bleeding or could be a frank hematoma. CT scan suggested for further evaluation. Old  left PCA territory infarction. Chronic small-vessel ischemic changes elsewhere throughout the brain. No major vessel occlusion or correctable proximal stenosis. Distal vessel atherosclerotic irregularity diffusely.  Carotid Doppler Bilateral: intimal wall thickening CCA. mild mixed plaque origin ICA. 1-39% ICA plaquing. Vertebral artery flow is antegrade.  2-D echocardiogram: Mild left ventricular hypertrophy, ejection fraction 55%. Wall motion was normal.    Laboratory evaluation showed A1c of 6.4, LDL 96, his symptoms has much improved, was started on Eliquis later.  He was [facility] to the [facility] June 21 2016. He was noted to have staring into space, slurred speech, gaze preference to the right side, he had prolonged complex partial seizure lasting more than 90 minutes, was loaded with Keppra, now taking Keppra 500 mg twice a day. EEG showed generalized slowing.  He lives at home, widowed, has 4 children take turns looking after him, has 24x 7 cares at home, he has good appetite, has dysphagia, swallowing studies pending, children reported that he has broken sleep pattern, get very restless confused at evening time, woke up at nighttime, he is still able to attend his chickens, feet them during the day, walk around the yard,  He was started on trazodone 25 mg every night since October 2017, which has been helpful.  I have personally reviewed MRI of the brain on April 19 2016, acute infarction within the right MCA territory, largest 3 cm, associated with blood product, old left MCA stroke involving left occipital region, generalized atrophy, supratentorium small vessel disease.  MRA of the brain showed no large vessel disease.  Laboratory evaluation in August 2017 showed normal B12 672, normal  CBC, with hemoglobin 13.3, cholesterol 151, LDL 96, elevated A1c 6.4,  EEG showed generalized slowing on Oct 16th 2017  UPDATE Feb 26th 2018: He has difficulty sleeping at night,  up most of night,  He take frequent nap during the day, at night, he can be agitated, his trazadone was increased from 50 to 75 mg, which has been helpful,  He is also taking melatonin 10mg  qhs.  I reviewed the laboratory evaluation from his primary care physician October 12 2016, normal WBC 4.3, mildly decreased hemoglobin 11.8, creatinine 1.22, LDL 48, cholesterol 109, A1c was 6.2, walk. Acid level was 68,  UPDATE November 23 2016: He was given seroqueal titrating dose up to 25mg  3 tab qhs, but still could not sleep well at nighttime, he pulls the curtain down, very confused, he is also taking melatonin 10 mg every night, trazodone 75 mg every night  He has no recurrent seizure taking Depakote DR 500 mg twice a day  REVIEW OF SYSTEMS: Full 14 system review of systems performed and notable only for as above  ALLERGIES: Allergies  Allergen Reactions  . Ciprofloxacin Other (See Comments)    Made pt crazy    HOME MEDICATIONS: Current Outpatient Prescriptions  Medication Sig Dispense Refill  . atorvastatin (LIPITOR) 20 MG tablet Take 1 tablet (20 mg total) by mouth daily at 6 PM. 30 tablet 2  . Cyanocobalamin (B-12 PO) Take 1,000 mcg by mouth daily.     . divalproex (DEPAKOTE) 500 MG DR tablet Take 1 tablet (500 mg total) by mouth 2 (two) times daily. 60 tablet 11  . ELIQUIS 5 MG TABS tablet Take 5 mg by mouth daily.    . furosemide (LASIX) 40 MG tablet Take 40 mg by mouth as needed.     . Melatonin 10 MG TABS Take by mouth at bedtime.    . metFORMIN (GLUCOPHAGE) 500 MG tablet Take 500 mg by mouth daily with breakfast.     . metoprolol tartrate (LOPRESSOR) 25 mg/10 mL SUSP Take 2.5 mLs (6.25 mg total) by mouth every 6 (six) hours.    . nitrofurantoin (MACRODANTIN) 100 MG capsule Take 100 mg by mouth daily.    . QUEtiapine (SEROQUEL) 25 MG tablet Take 2 tablets (50 mg total) by mouth at bedtime. 60 tablet 11  . TRAZODONE HCL PO Take 75 mg by mouth at bedtime.     No current  facility-administered medications for this visit.     PAST MEDICAL HISTORY: Past Medical History:  Diagnosis Date  . Chronic atrial fibrillation (HCC)   . Diabetes mellitus type II, controlled (HCC)   . HTN (hypertension)   . Stroke [facility]) 04/2016    PAST SURGICAL HISTORY: Past Surgical History:  Procedure Laterality Date  . HERNIA REPAIR    . PROSTATE SURGERY      FAMILY HISTORY: No family history on file.  SOCIAL HISTORY:  Social History   Social History  . Marital status: Widowed    Spouse name: N/A  . Number of children: 5  . Years of education: 6-7 years   Occupational History  . Retired    Social History Main Topics  . Smoking status: Former Games developer  . Smokeless tobacco: Never Used     Comment: quit many years ago  . Alcohol use No  . Drug use: No  . Sexual activity: Not on file   Other Topics Concern  . Not on file   Social History Narrative   Lives at home alone with  24-hour supervision.   Right-handed.   No caffeine use.      PHYSICAL EXAM   Vitals:   11/23/16 1608  BP: (!) 144/75  Pulse: 83  Weight: 198 lb 8 oz (90 kg)  Height: 5\' 9"  (1.753 m)    Not recorded      Body mass index is 29.31 kg/m.  PHYSICAL EXAMNIATION:  Gen: NAD, conversant, well nourised, obese, well groomed                     Cardiovascular: Regular rate rhythm, no peripheral edema, warm, nontender. Eyes: Conjunctivae clear without exudates or hemorrhage Neck: Supple, no carotid bruits. Pulmonary: Clear to auscultation bilaterally   NEUROLOGICAL EXAM:  MENTAL STATUS: Speech:    Speech is normal; fluent and spontaneous with normal comprehension.  Cognition: He is sleepy, not off frequently during conversation, is oriented to his name, not oriented to time, place, Is not oriented to his daughter's name   CRANIAL NERVES: CN [id number]:Right pupil was small, left pupil was irregular, pink to bilateral visual threat  CN V: Facial sensation is intact to pinprick in all  3 divisions bilaterally. Corneal responses are intact.  CN VII: Face is symmetric with normal eye closure and smile. CN VIII: Very hard of hearing CN IX, X: Palate elevates symmetrically. Phonation is normal. CN XI: Head turning and shoulder shrug are intact CN XII: Tongue is midline with normal movements and no atrophy.  MOTOR: Mildly increased 4 extremity tones, no significant weakness,  REFLEXES: Reflexes were hypoactive and symmetric at the biceps, triceps, knees, and ankles. Plantar responses are flexor.  SENSORY: Intact to light touch, pinprick, positional sensation and vibratory sensation are intact in fingers and toes.  COORDINATION: Rapid alternating movements and fine finger movements are intact. There is no dysmetria on finger-to-nose and heel-knee-shin.    GAIT/STANCE: He need assistant to get up from seated position, bending forward, unsteady  DIAGNOSTIC DATA (LABS, IMAGING, TESTING) - I reviewed patient records, labs, notes, testing and imaging myself where available.   ASSESSMENT AND PLAN  Barry Jones is a 83 y.o. male    Embolic stroke Chronic atrial fibrillation Complex partial seizure Dementia with behavioral issues, evening time confusion, agitations  Keep Depakote DR 500 mg twice a day  Keep trazodone 75 mg every night   Started seroquel  Risperdal 0.5 milligrams my titrating to 4 tablets every night, clonazepam 0.5 milligram as needed   Levert FeinsteinYijun Khris Jansson, M.D. Ph.D.  [facility] [address], [address] [place], [place] [address] Ph: [phone] Fax: [phone]  CC: Referring Provider

## 2016-11-23 NOTE — Telephone Encounter (Signed)
Spoke to patient - Dr. Terrace ArabiaYan would like to see him the office today as a work-in.  She needs a late afternoon appt and I have offered for them to come in at 4pm.  She is going to check with another family member and call me back to confirm this time.

## 2016-11-23 NOTE — Patient Instructions (Signed)
Stop seroquel  Start risperidone 0.5mg  1-2 tabs qhs,   May titrate to 2mg  every night=0.5mg   4 tabs  If that still does not help, may give him clonazepam 0.5mg  as needed.

## 2016-11-26 ENCOUNTER — Encounter: Payer: Self-pay | Admitting: Sports Medicine

## 2016-11-26 ENCOUNTER — Ambulatory Visit (INDEPENDENT_AMBULATORY_CARE_PROVIDER_SITE_OTHER): Payer: Medicare Other | Admitting: Sports Medicine

## 2016-11-26 DIAGNOSIS — M79671 Pain in right foot: Secondary | ICD-10-CM

## 2016-11-26 DIAGNOSIS — E1142 Type 2 diabetes mellitus with diabetic polyneuropathy: Secondary | ICD-10-CM

## 2016-11-26 DIAGNOSIS — I739 Peripheral vascular disease, unspecified: Secondary | ICD-10-CM

## 2016-11-26 DIAGNOSIS — B351 Tinea unguium: Secondary | ICD-10-CM

## 2016-11-26 DIAGNOSIS — M79672 Pain in left foot: Secondary | ICD-10-CM

## 2016-11-26 NOTE — Progress Notes (Signed)
Patient ID: Barry Jones, male   DOB: 01-13-1931, 83 y.o.   MRN: [id number]   Subjective: Barry Jones is a 83 y.o. male patient with history of type 2 diabetes who presents to office today complaining of long, painful nails  while ambulating in shoes; unable to trim. Patient is assisted by son who states that the glucose reading this morning was good; reports that dad had a another stroke. No other issues.   Patient Active Problem List   Diagnosis Date Noted  . Dementia with behavioral disturbance 11/23/2016  . Complex partial seizure (HCC) 07/20/2016  . Dysphagia 06/23/2016  . Dysphagia, oropharyngeal phase   . CVA (cerebral vascular accident) (HCC) 06/21/2016  . Dehydration 06/21/2016  . Acute encephalopathy 06/21/2016  . Atrial fibrillation (HCC) 04/21/2016  . Dementia 04/21/2016  . Hyperlipidemia LDL goal <70 04/21/2016  . Diabetes mellitus type II, controlled (HCC) 04/21/2016  . Anterior cerebral circulation hemorrhagic infarction (HCC) 04/21/2016  . 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 Essential (primary) hypertension 09/10/2015  . Local edema 09/10/2015  . Supraventricular tachycardia (HCC) 09/10/2015   Current Outpatient Prescriptions on File Prior to Visit  Medication Sig Dispense Refill  . atorvastatin (LIPITOR) 20 MG tablet Take 1 tablet (20 mg total) by mouth daily at 6 PM. 30 tablet 2  . clonazePAM (KLONOPIN) 0.5 MG tablet Take 1 tablet (0.5 mg total) by mouth at bedtime as needed for anxiety. 30 tablet 3  . Cyanocobalamin (B-12 PO) Take 1,000 mcg by mouth daily.     . divalproex (DEPAKOTE) 500 MG DR tablet Take 1 tablet (500 mg total) by mouth 2 (two) times daily. 60 tablet 11  . ELIQUIS 5 MG TABS tablet Take 5 mg by mouth daily.    . furosemide (LASIX) 40 MG tablet Take 40 mg by mouth as needed.     . Melatonin 10 MG TABS Take by mouth at bedtime.    . metFORMIN (GLUCOPHAGE) 500 MG tablet Take 500 mg by mouth daily with breakfast.     .  metoprolol tartrate (LOPRESSOR) 25 mg/10 mL SUSP Take 2.5 mLs (6.25 mg total) by mouth every 6 (six) hours.    . nitrofurantoin (MACRODANTIN) 100 MG capsule Take 100 mg by mouth daily.    . risperiDONE (RISPERDAL) 0.5 MG tablet Take 2 tablets (1 mg total) by mouth at bedtime. 120 tablet 6   No current facility-administered medications on file prior to visit.    Allergies  Allergen Reactions  . Ciprofloxacin Other (See Comments)    Made pt crazy    Objective: General: Patient is awake, alert, and oriented x 3 and in no acute distress.  Integument: Skin is warm, dry and supple bilateral. Nails are tender, long, thickened and  dystrophic with subungual debris, consistent with onychomycosis, 1-5 bilateral. No signs of infection. No open lesions. Resolved callus. Remaining integument unremarkable.  Vasculature:  Dorsalis Pedis pulse 1/4 bilateral. Posterior Tibial pulse  0/4 bilateral.  Capillary fill time <4 sec 1-5 bilateral. No hair growth to the level of the digits. Temperature gradient within normal limits. +varicosities present bilateral. 1+ pitting edema present bilateral ankles.   Neurology: The patient has diminished sensation measured with a 5.07/10g Semmes Weinstein Monofilament at all pedal sites bilateral . Vibratory sensation diminished bilateral with tuning fork. No Babinski sign present bilateral.   Musculoskeletal: No gross pedal deformities noted bilateral. Muscular strength 5/5 in all lower extremity muscular groups bilateral without pain on range of motion .  No tenderness with calf compression bilateral.  Assessment and Plan: Problem List Items Addressed This Visit    None    Visit Diagnoses    Dermatophytosis of nail    -  Primary   PVD (peripheral vascular disease) (HCC)       Diabetic polyneuropathy associated with type 2 diabetes mellitus (HCC)       Relevant Medications   donepezil (ARICEPT) 5 MG tablet   memantine (NAMENDA) 5 MG tablet   QUEtiapine (SEROQUEL)  25 MG tablet   traZODone (DESYREL) 100 MG tablet   Foot pain, bilateral         -Examined patient. -Discussed and educated patient on diabetic foot care, especially with regards to the vascular, neurological and musculoskeletal systems.  -Stressed the importance of good glycemic control and the detriment of not controlling glucose levels in relation to the foot. -Mechanically all nails 1-5 bilateral using sterile nail nipper and filed with dremel without incident  -Recommend elevation of legs daily to assist with edema control -Recommend good supportive shoes daily  -Answered all patient questions -Patient to return in 3 months for at risk foot care -Patient advised to call the office if any problems or questions arise in the meantime.  Asencion Islamitorya Loletha Bertini, DPM

## 2016-12-10 DIAGNOSIS — D649 Anemia, unspecified: Secondary | ICD-10-CM | POA: Diagnosis not present

## 2016-12-10 DIAGNOSIS — J90 Pleural effusion, not elsewhere classified: Secondary | ICD-10-CM | POA: Diagnosis not present

## 2016-12-10 DIAGNOSIS — Z87891 Personal history of nicotine dependence: Secondary | ICD-10-CM | POA: Diagnosis not present

## 2016-12-10 DIAGNOSIS — J690 Pneumonitis due to inhalation of food and vomit: Secondary | ICD-10-CM | POA: Diagnosis not present

## 2016-12-10 DIAGNOSIS — G309 Alzheimer's disease, unspecified: Secondary | ICD-10-CM | POA: Diagnosis not present

## 2016-12-10 DIAGNOSIS — R4020 Unspecified coma: Secondary | ICD-10-CM | POA: Diagnosis not present

## 2016-12-10 DIAGNOSIS — R1310 Dysphagia, unspecified: Secondary | ICD-10-CM | POA: Diagnosis not present

## 2016-12-10 DIAGNOSIS — I69398 Other sequelae of cerebral infarction: Secondary | ICD-10-CM | POA: Diagnosis not present

## 2016-12-10 DIAGNOSIS — G300 Alzheimer's disease with early onset: Secondary | ICD-10-CM | POA: Diagnosis not present

## 2016-12-10 DIAGNOSIS — E119 Type 2 diabetes mellitus without complications: Secondary | ICD-10-CM | POA: Diagnosis not present

## 2016-12-10 DIAGNOSIS — J9602 Acute respiratory failure with hypercapnia: Secondary | ICD-10-CM | POA: Diagnosis not present

## 2016-12-10 DIAGNOSIS — Z66 Do not resuscitate: Secondary | ICD-10-CM | POA: Diagnosis not present

## 2016-12-10 DIAGNOSIS — E785 Hyperlipidemia, unspecified: Secondary | ICD-10-CM | POA: Diagnosis not present

## 2016-12-10 DIAGNOSIS — I6529 Occlusion and stenosis of unspecified carotid artery: Secondary | ICD-10-CM | POA: Diagnosis not present

## 2016-12-10 DIAGNOSIS — I4891 Unspecified atrial fibrillation: Secondary | ICD-10-CM | POA: Diagnosis not present

## 2016-12-10 DIAGNOSIS — J9601 Acute respiratory failure with hypoxia: Secondary | ICD-10-CM | POA: Diagnosis not present

## 2016-12-10 DIAGNOSIS — N179 Acute kidney failure, unspecified: Secondary | ICD-10-CM | POA: Diagnosis not present

## 2016-12-10 DIAGNOSIS — I471 Supraventricular tachycardia: Secondary | ICD-10-CM | POA: Diagnosis not present

## 2016-12-10 DIAGNOSIS — Z515 Encounter for palliative care: Secondary | ICD-10-CM | POA: Diagnosis not present

## 2016-12-10 DIAGNOSIS — E871 Hypo-osmolality and hyponatremia: Secondary | ICD-10-CM | POA: Diagnosis not present

## 2016-12-10 DIAGNOSIS — I6523 Occlusion and stenosis of bilateral carotid arteries: Secondary | ICD-10-CM | POA: Diagnosis not present

## 2016-12-10 DIAGNOSIS — D696 Thrombocytopenia, unspecified: Secondary | ICD-10-CM | POA: Diagnosis not present

## 2016-12-10 DIAGNOSIS — R402431 Glasgow coma scale score 3-8, in the field [EMT or ambulance]: Secondary | ICD-10-CM | POA: Diagnosis not present

## 2016-12-10 DIAGNOSIS — N3000 Acute cystitis without hematuria: Secondary | ICD-10-CM | POA: Diagnosis not present

## 2016-12-10 DIAGNOSIS — J9811 Atelectasis: Secondary | ICD-10-CM | POA: Diagnosis not present

## 2016-12-10 DIAGNOSIS — Z8673 Personal history of transient ischemic attack (TIA), and cerebral infarction without residual deficits: Secondary | ICD-10-CM | POA: Diagnosis not present

## 2016-12-10 DIAGNOSIS — I10 Essential (primary) hypertension: Secondary | ICD-10-CM | POA: Diagnosis not present

## 2016-12-10 DIAGNOSIS — R55 Syncope and collapse: Secondary | ICD-10-CM | POA: Diagnosis not present

## 2016-12-10 DIAGNOSIS — B9789 Other viral agents as the cause of diseases classified elsewhere: Secondary | ICD-10-CM | POA: Diagnosis not present

## 2017-01-05 DEATH — deceased

## 2017-01-12 IMAGING — RF DG SWALLOWING FUNCTION - NRPT [facility]
1 series · 18 of 24 positions shown · non-contrast
Comparison: none

[Series 1: run · 17 acquisitions, 18 frames shown]
[im 1/17]
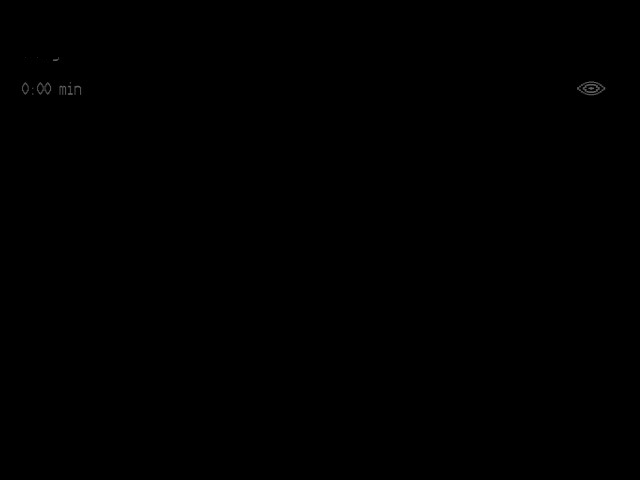
[im 2/17]
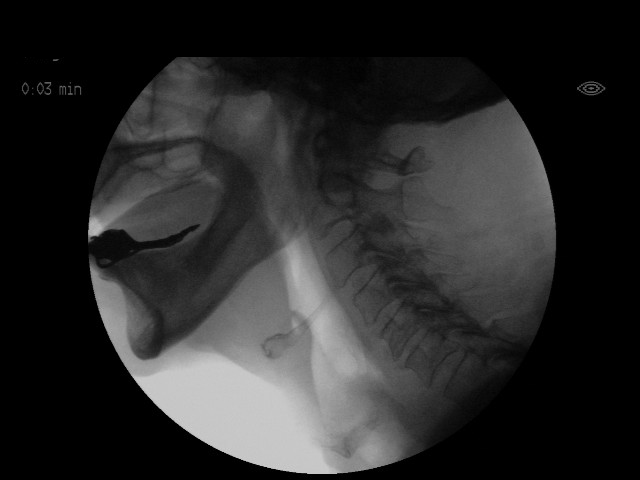
[im 3/17]
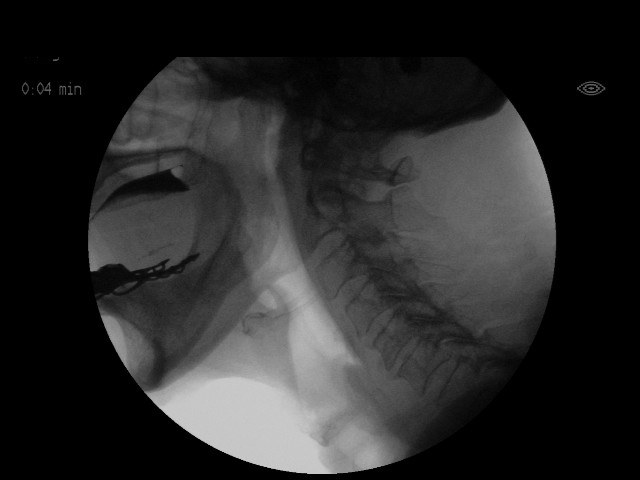
[im 4/17]
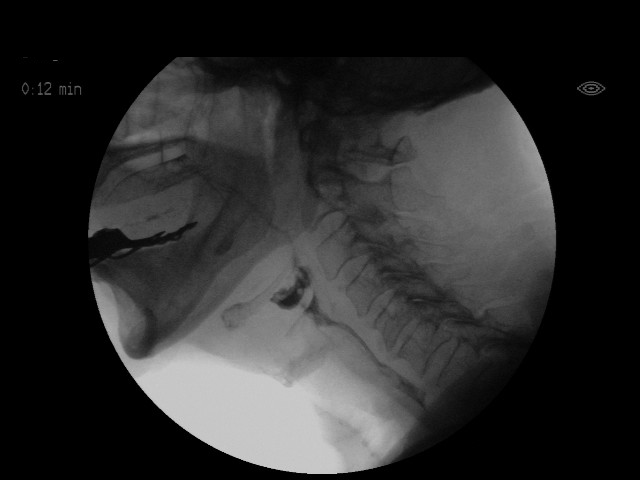
[im 5/17]
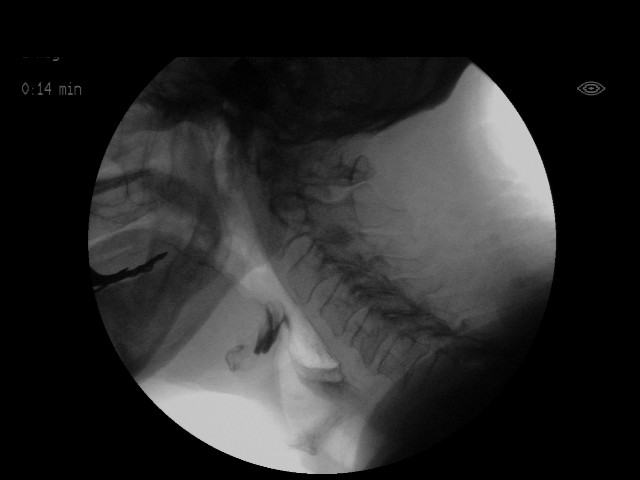
[im 6/17]
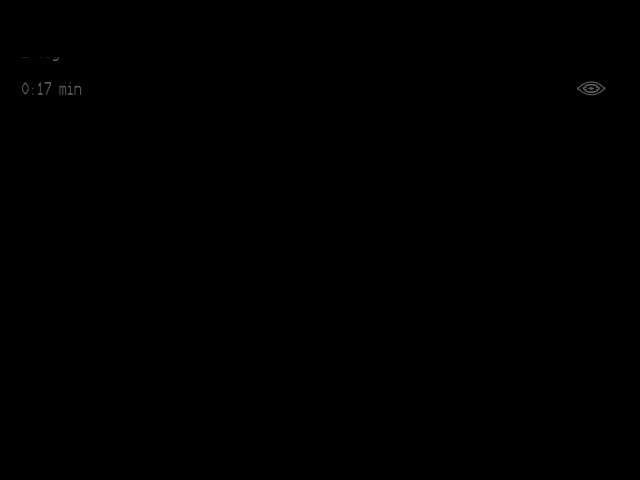
[im 6/17]
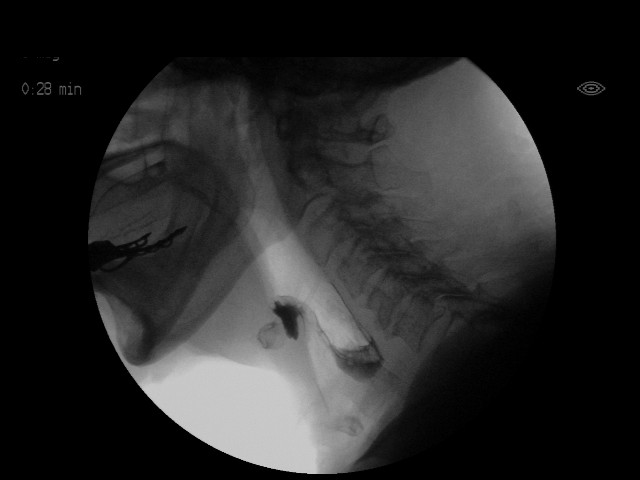
[im 8/17]
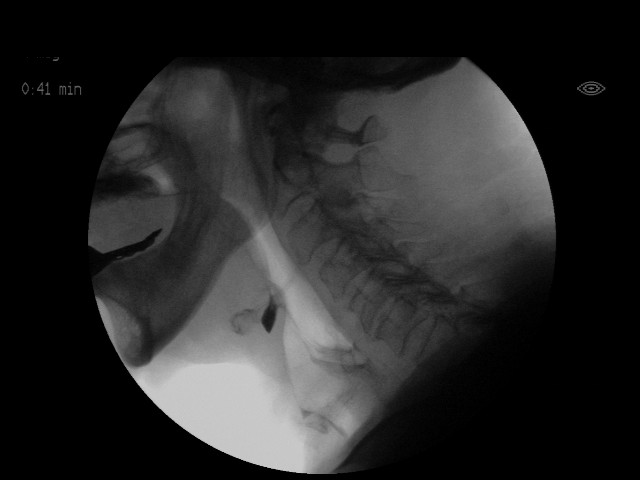
[im 9/17]
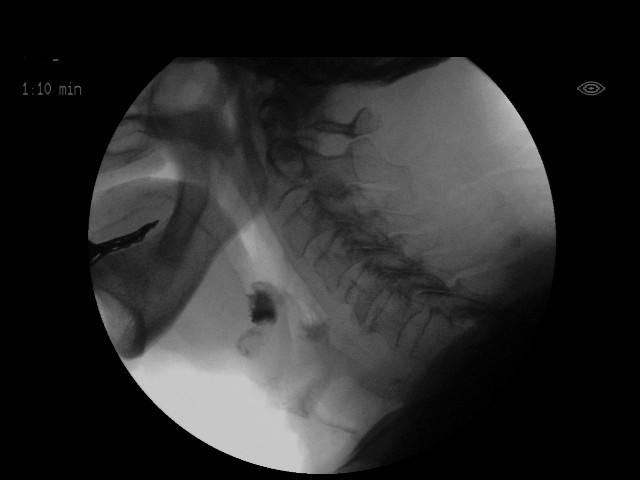
[im 9/17]
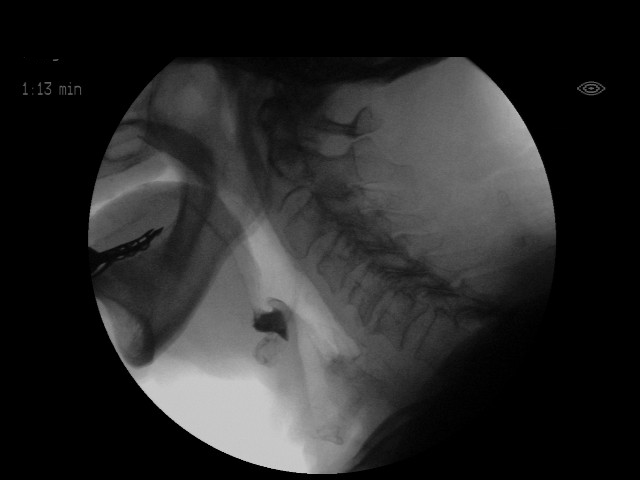
[im 11/17]
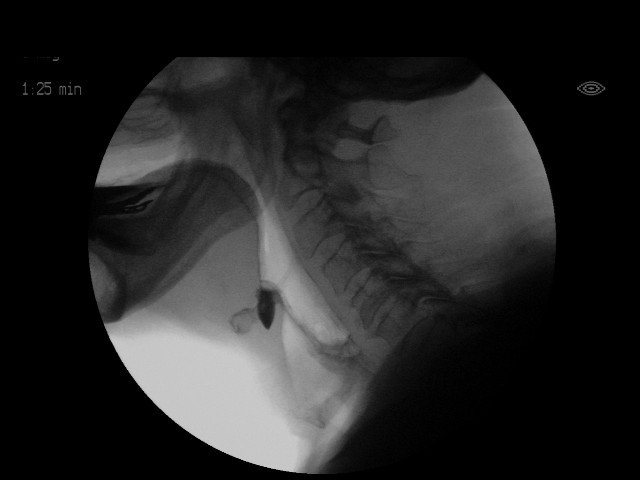
[im 12/17]
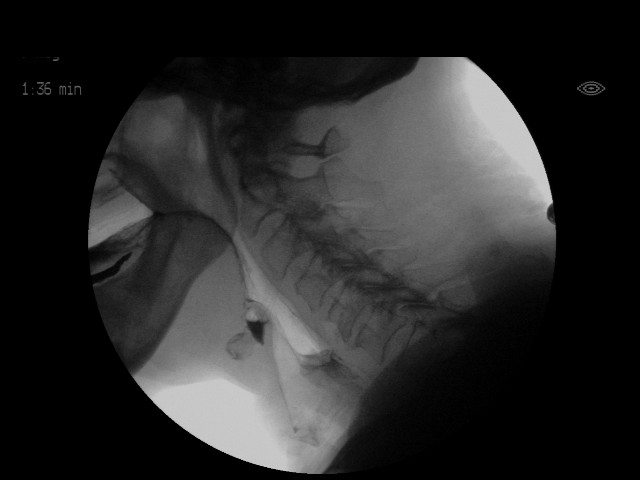
[im 12/17]
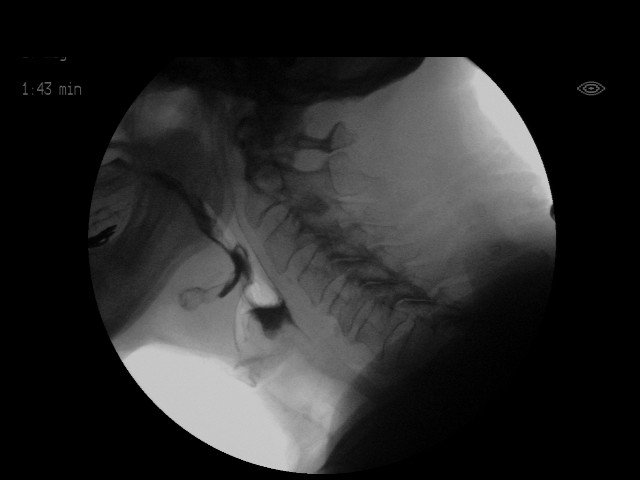
[im 14/17]
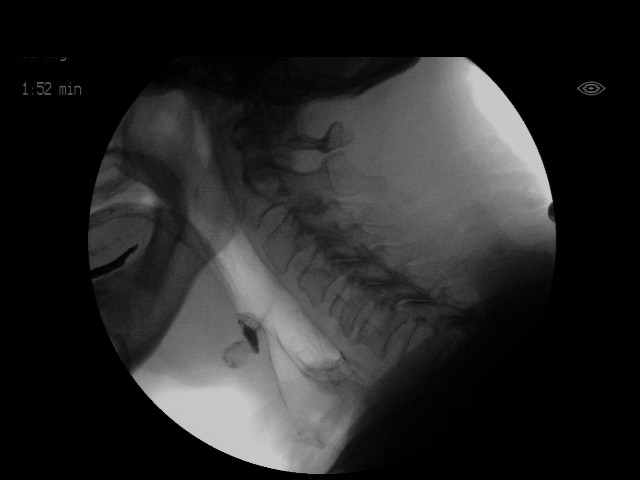
[im 14/17]
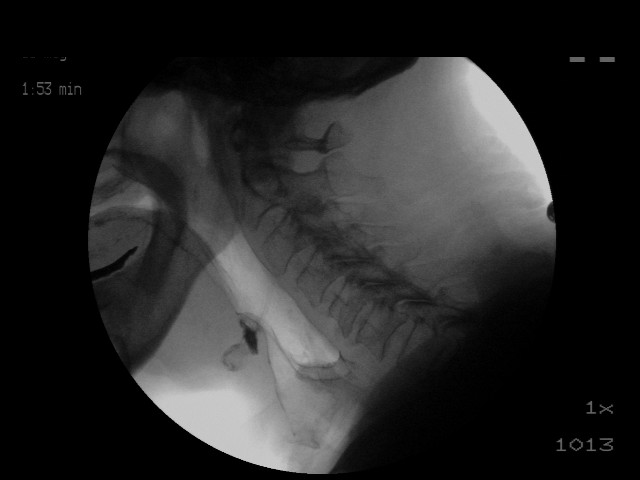
[im 15/17]
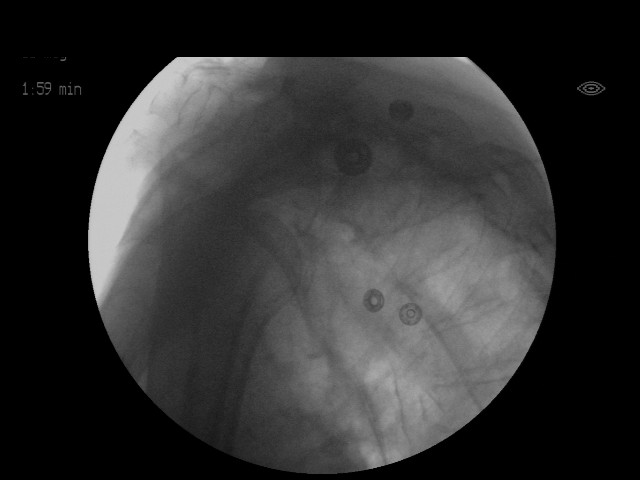
[im 17/17]
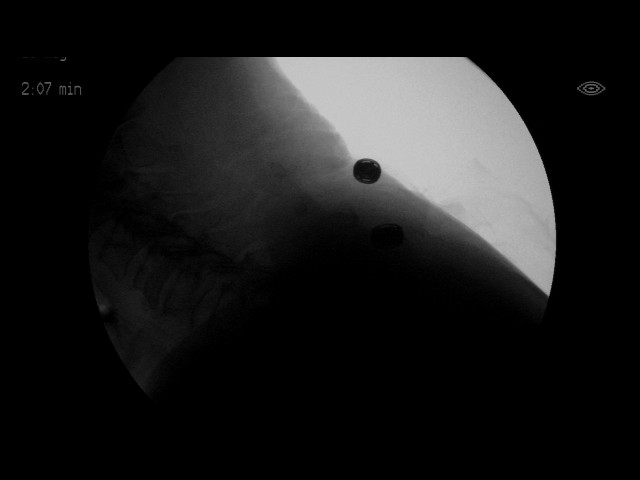
[im 17/17]
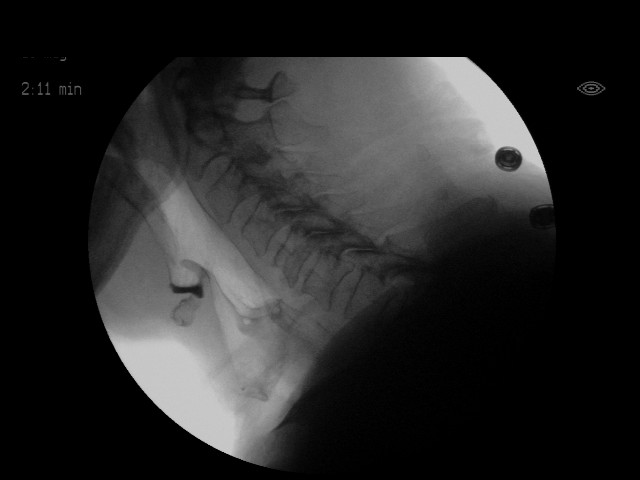

[18 of 24 positions shown; findings below may reference images not displayed]

FLUOROSCOPY FOR SWALLOWING FUNCTION STUDY:
Fluoroscopy was provided for swallowing function study, which was administered by a speech pathologist.  Final results and recommendations from this study are contained within the speech pathology report.

## 2017-01-14 ENCOUNTER — Ambulatory Visit: Payer: Medicare Other | Admitting: Neurology

## 2017-01-15 IMAGING — RF DG SWALLOWING FUNCTION - NRPT [facility]
1 series · 18 of 24 positions shown · non-contrast
Comparison: none

[Series 1: run · 34 acquisitions, 18 frames shown]
[im 1/34]
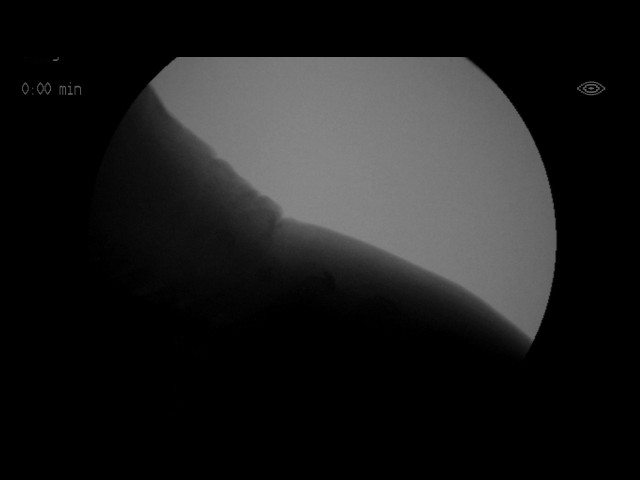
[im 3/34]
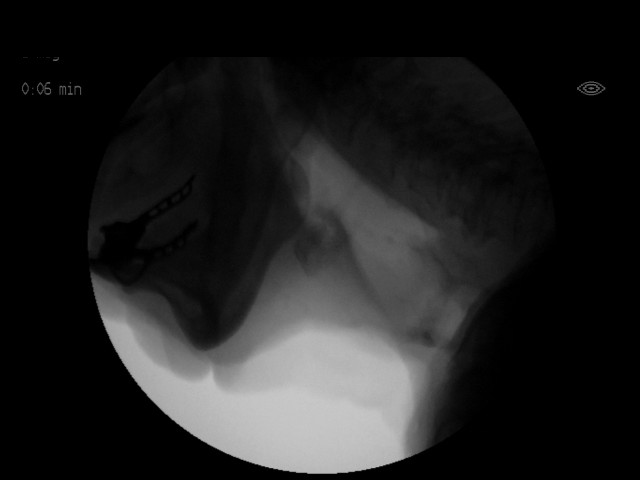
[im 5/34]
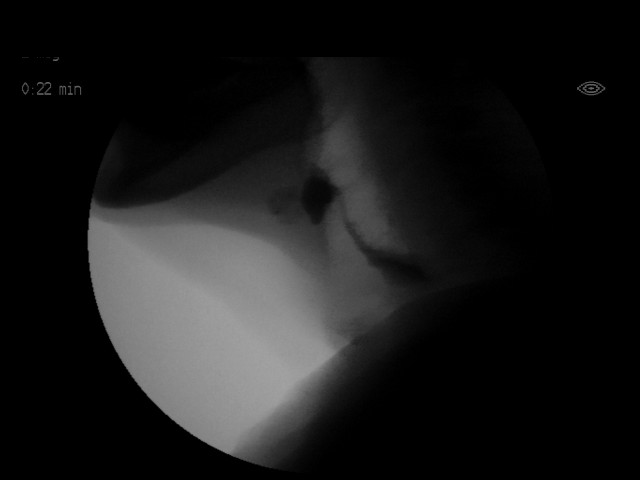
[im 6/34]
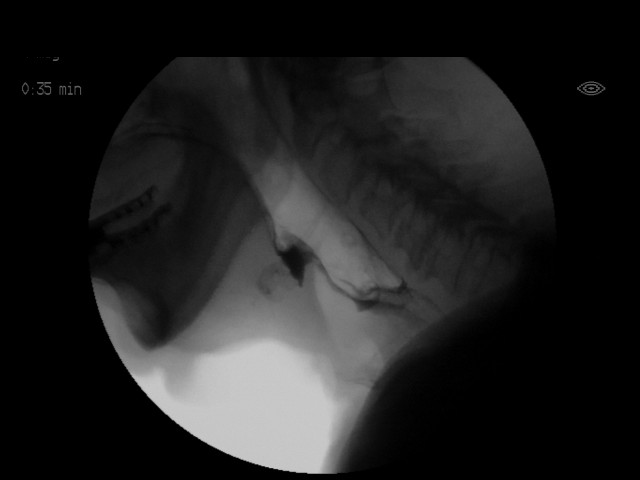
[im 9/34]
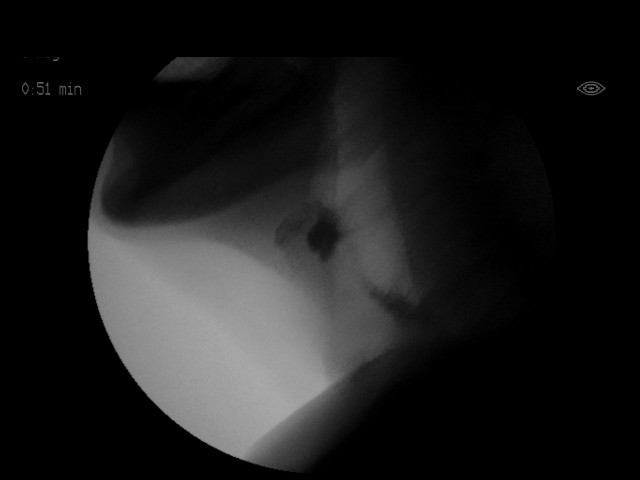
[im 11/34]
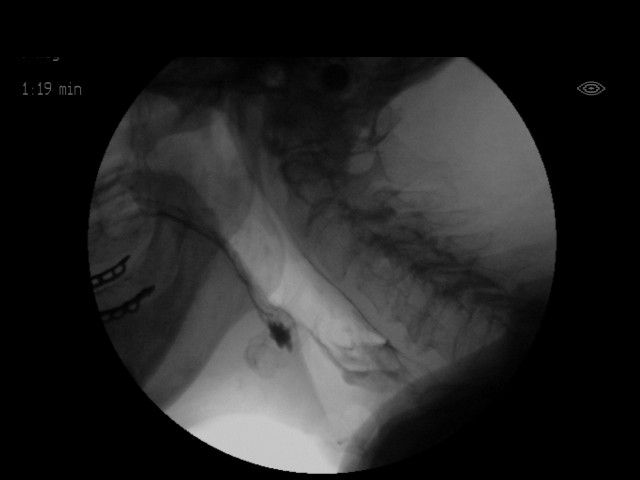
[im 12/34]
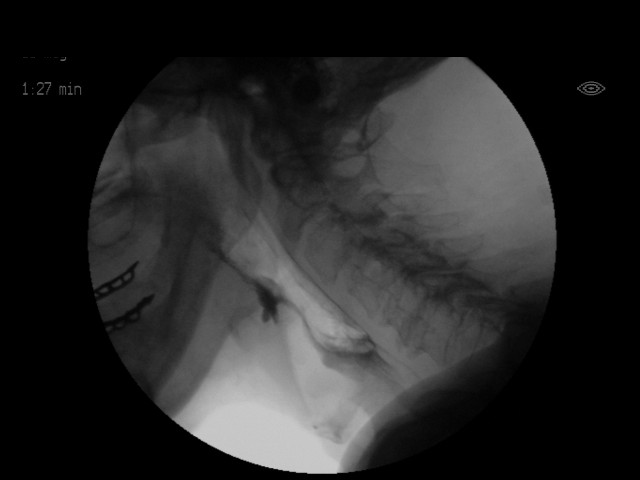
[im 15/34]
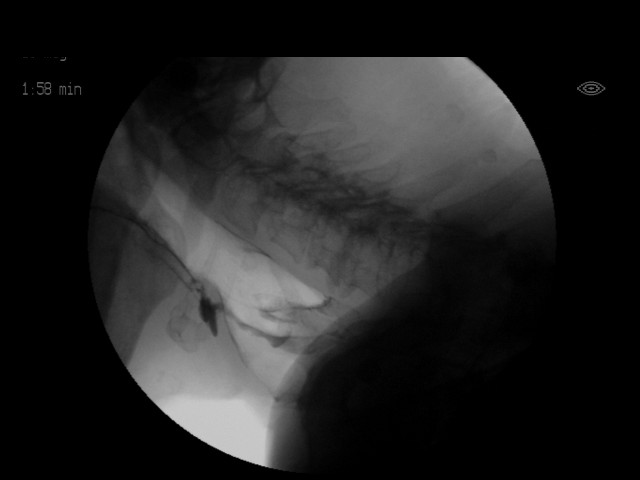
[im 16/34]
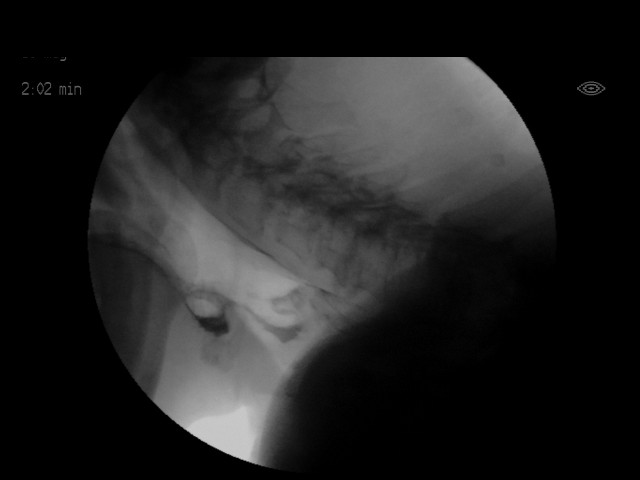
[im 18/34]
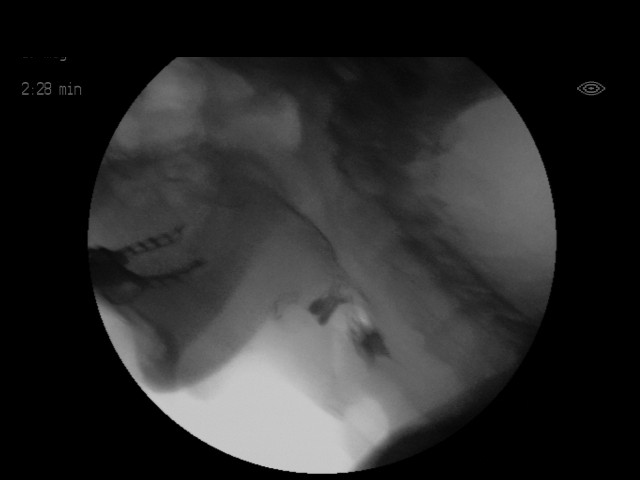
[im 21/34]
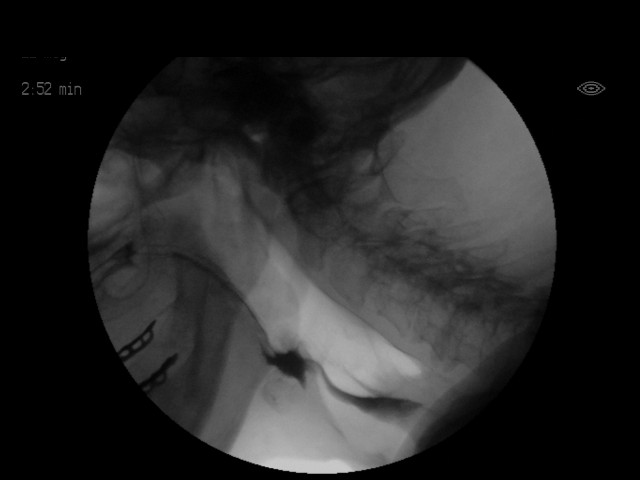
[im 22/34]
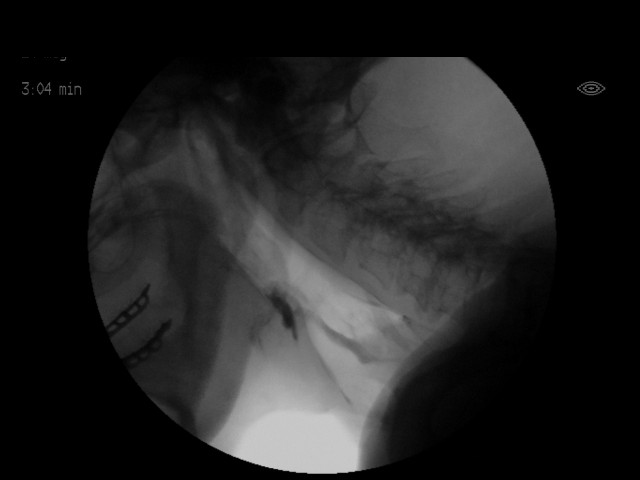
[im 23/34]
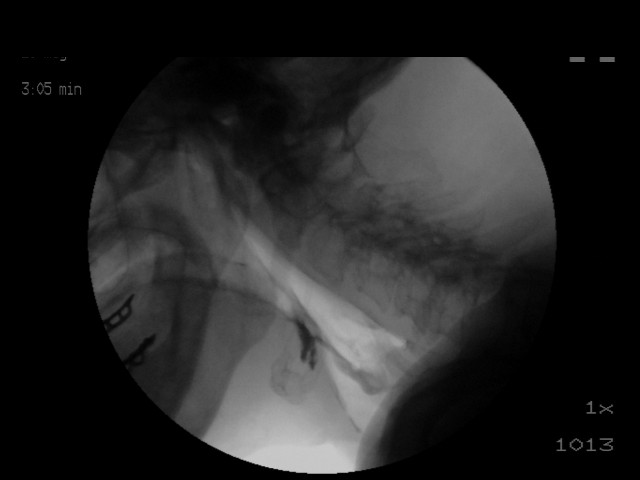
[im 26/34]
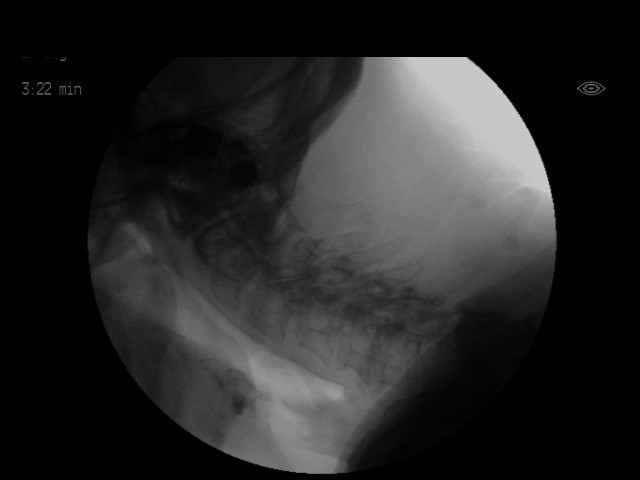
[im 28/34]
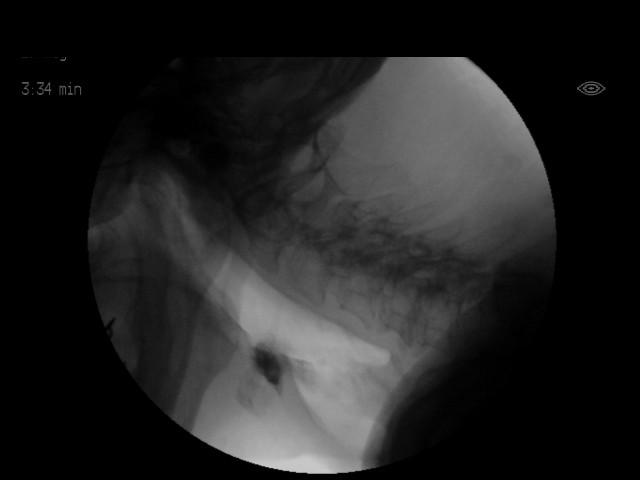
[im 29/34]
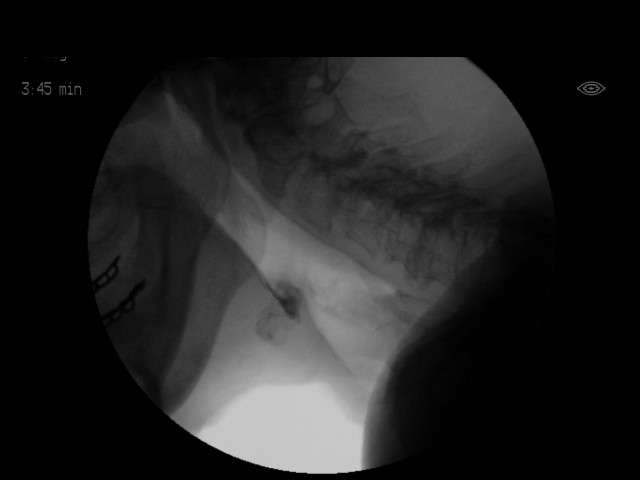
[im 32/34]
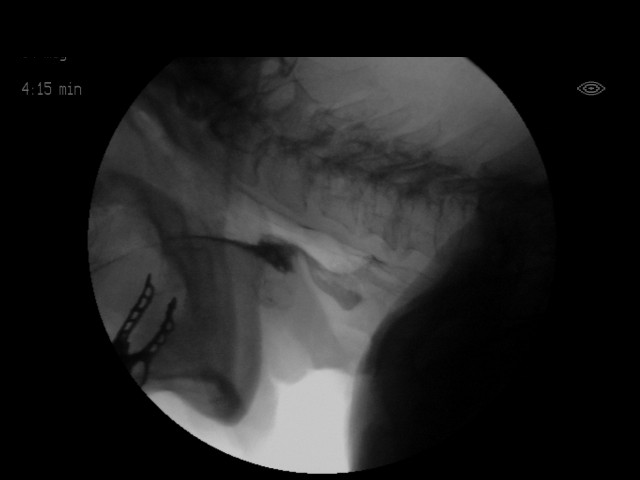
[im 34/34]
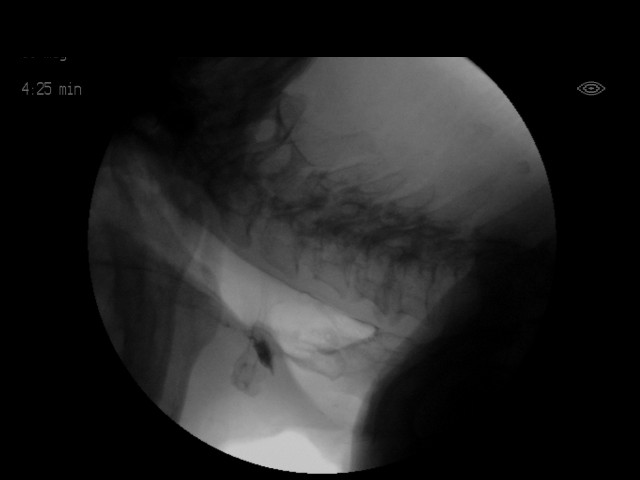

[18 of 24 positions shown; findings below may reference images not displayed]

FLUOROSCOPY FOR SWALLOWING FUNCTION STUDY:
Fluoroscopy was provided for swallowing function study, which was administered by a speech pathologist.  Final results and recommendations from this study are contained within the speech pathology report.

## 2017-02-24 ENCOUNTER — Ambulatory Visit: Payer: Medicare Other | Admitting: Sports Medicine

## 2017-05-04 ENCOUNTER — Ambulatory Visit: Payer: Medicare Other | Admitting: Adult Health
